# Patient Record
Sex: Female | Born: 2005 | Race: Black or African American | Hispanic: No | Marital: Single | Smoking: Former smoker
Health system: Southern US, Community
[De-identification: ages and names within clinical notes are randomized; demographics above are authoritative.]

## PROBLEM LIST (undated history)

## (undated) DIAGNOSIS — L309 Dermatitis, unspecified: Secondary | ICD-10-CM

## (undated) HISTORY — PX: WISDOM TOOTH EXTRACTION: SHX21

## (undated) HISTORY — DX: Dermatitis, unspecified: L30.9

---
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
## 2023-01-07 DIAGNOSIS — H5213 Myopia, bilateral: Secondary | ICD-10-CM | POA: Diagnosis not present

## 2023-02-06 ENCOUNTER — Encounter: Payer: Self-pay | Admitting: Advanced Practice Midwife

## 2023-03-25 DIAGNOSIS — Z0000 Encounter for general adult medical examination without abnormal findings: Secondary | ICD-10-CM | POA: Diagnosis not present

## 2023-03-25 DIAGNOSIS — M545 Low back pain, unspecified: Secondary | ICD-10-CM | POA: Diagnosis not present

## 2023-03-25 DIAGNOSIS — Z7189 Other specified counseling: Secondary | ICD-10-CM | POA: Diagnosis not present

## 2023-03-25 DIAGNOSIS — L209 Atopic dermatitis, unspecified: Secondary | ICD-10-CM | POA: Diagnosis not present

## 2023-03-25 DIAGNOSIS — Z713 Dietary counseling and surveillance: Secondary | ICD-10-CM | POA: Diagnosis not present

## 2023-03-25 DIAGNOSIS — J309 Allergic rhinitis, unspecified: Secondary | ICD-10-CM | POA: Diagnosis not present

## 2023-03-25 DIAGNOSIS — Z1331 Encounter for screening for depression: Secondary | ICD-10-CM | POA: Diagnosis not present

## 2023-04-08 DIAGNOSIS — M545 Low back pain, unspecified: Secondary | ICD-10-CM | POA: Diagnosis not present

## 2023-04-28 DIAGNOSIS — M545 Low back pain, unspecified: Secondary | ICD-10-CM | POA: Diagnosis not present

## 2023-04-28 DIAGNOSIS — M546 Pain in thoracic spine: Secondary | ICD-10-CM | POA: Diagnosis not present

## 2023-05-03 DIAGNOSIS — L209 Atopic dermatitis, unspecified: Secondary | ICD-10-CM | POA: Diagnosis not present

## 2023-05-03 DIAGNOSIS — J309 Allergic rhinitis, unspecified: Secondary | ICD-10-CM | POA: Diagnosis not present

## 2023-05-03 DIAGNOSIS — Z7189 Other specified counseling: Secondary | ICD-10-CM | POA: Diagnosis not present

## 2023-05-03 DIAGNOSIS — Z0000 Encounter for general adult medical examination without abnormal findings: Secondary | ICD-10-CM | POA: Diagnosis not present

## 2023-05-03 DIAGNOSIS — Z1331 Encounter for screening for depression: Secondary | ICD-10-CM | POA: Diagnosis not present

## 2023-05-03 DIAGNOSIS — M545 Low back pain, unspecified: Secondary | ICD-10-CM | POA: Diagnosis not present

## 2023-05-03 DIAGNOSIS — Z713 Dietary counseling and surveillance: Secondary | ICD-10-CM | POA: Diagnosis not present

## 2023-05-12 ENCOUNTER — Encounter: Payer: Self-pay | Admitting: Advanced Practice Midwife

## 2023-05-15 ENCOUNTER — Encounter: Payer: Self-pay | Admitting: Advanced Practice Midwife

## 2023-05-15 ENCOUNTER — Other Ambulatory Visit (HOSPITAL_COMMUNITY)
Admission: RE | Admit: 2023-05-15 | Discharge: 2023-05-15 | Disposition: A | Discharge status: Home / Self Care | Payer: Medicaid Other | Source: Ambulatory Visit | Attending: Advanced Practice Midwife | Admitting: Advanced Practice Midwife

## 2023-05-15 ENCOUNTER — Ambulatory Visit (INDEPENDENT_AMBULATORY_CARE_PROVIDER_SITE_OTHER): Payer: Medicaid Other | Admitting: Advanced Practice Midwife

## 2023-05-15 VITALS — BP 101/61 | HR 80 | Ht 62.0 in | Wt 91.0 lb

## 2023-05-15 DIAGNOSIS — N898 Other specified noninflammatory disorders of vagina: Secondary | ICD-10-CM

## 2023-05-15 DIAGNOSIS — L989 Disorder of the skin and subcutaneous tissue, unspecified: Secondary | ICD-10-CM

## 2023-05-15 DIAGNOSIS — L732 Hidradenitis suppurativa: Secondary | ICD-10-CM

## 2023-05-15 DIAGNOSIS — Z01419 Encounter for gynecological examination (general) (routine) without abnormal findings: Secondary | ICD-10-CM

## 2023-05-15 DIAGNOSIS — Z0000 Encounter for general adult medical examination without abnormal findings: Secondary | ICD-10-CM

## 2023-05-15 DIAGNOSIS — Z3045 Encounter for surveillance of transdermal patch hormonal contraceptive device: Secondary | ICD-10-CM

## 2023-05-15 MED ORDER — DOXYCYCLINE HYCLATE 100 MG PO CAPS: 100.0000 mg | ORAL_CAPSULE | Freq: Two times a day (BID) | ORAL | 1 refills | Status: AC

## 2023-05-15 MED ORDER — NORELGESTROMIN-ETH ESTRADIOL 150-35 MCG/24HR TD PTWK: 1.0000 | MEDICATED_PATCH | TRANSDERMAL | 12 refills | Status: DC

## 2023-05-15 NOTE — Progress Notes (Signed)
Gynecology Annual Exam  PCP: Hermenia Fiscal, MD  Chief Complaint:  Chief Complaint  Patient presents with   Annual Exam   Contraception    Refill on patch    History of Present Illness: Patient is a 17 y.o. G0P0000 presents for annual exam. The patient has complaint today of ongoing painful bumps in pubic area. The bumps occur approximately every other month around the time of her period. They are painful and drain pus. They are itchy after they drain. Today she points out a painful swollen area in the upper thigh/groin area on the right. She also mentions vaginal odor. Will collect vaginitis aptima.   LMP: Patient's last menstrual period was 05/11/2023.  Average Interval: regular, 28 days Duration of flow:  5-7  days Heavy Menses: no Clots: no Intermenstrual Bleeding: no Postcoital Bleeding: no Dysmenorrhea: no  The patient is sexually active. She currently uses  Xulane patch  for contraception. She denies dyspareunia.  The patient does perform self breast exams.  There is no notable family history of breast or ovarian cancer in her family.  The patient wears seatbelts: yes.  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 She doesn't have much of an appetite and what she eats is healthy. She admits adequate hydration and sleep.    The patient denies current symptoms of depression.    Review of Systems: Review of Systems  Constitutional:  Negative for chills and fever.  HENT:  Positive for congestion. Negative for ear discharge, ear pain, hearing loss, sinus pain and sore throat.   Eyes:  Negative for blurred vision and double vision.  Respiratory:  Negative for cough, shortness of breath and wheezing.   Cardiovascular:  Negative for chest pain, palpitations and leg swelling.  Gastrointestinal:  Negative for abdominal pain, blood in stool, constipation, diarrhea, heartburn, melena, nausea and vomiting.  Genitourinary:  Negative for dysuria, flank  pain, frequency, hematuria and urgency.  Musculoskeletal:  Negative for back pain, joint pain and myalgias.  Skin:  Negative for itching and rash.       Positive for bump in pubic area  Neurological:  Negative for dizziness, tingling, tremors, sensory change, speech change, focal weakness, seizures, loss of consciousness, weakness and headaches.  Endo/Heme/Allergies:  Positive for environmental allergies. Does not bruise/bleed easily.  Psychiatric/Behavioral:  Negative for depression, hallucinations, memory loss, substance abuse and suicidal ideas. The patient is not nervous/anxious and does not have insomnia.     Past Medical History:  There are no problems to display for this patient.   Past Surgical History:  History reviewed. No pertinent surgical history.  Gynecologic History:  Patient's last menstrual period was 05/11/2023. Contraception:  Xulane patch   Obstetric History: G0P0000  Family History:  Family History  Problem Relation Age of Onset   Breast cancer Maternal Grandmother 82    Social History:  Social History   Socioeconomic History   Marital status: Single    Spouse name: Not on file   Number of children: Not on file   Years of education: Not on file   Highest education level: Not on file  Occupational History   Not on file  Tobacco Use   Smoking status: Former    Types: E-cigarettes    Start date: 09/17/2019    Quit date: 04/16/2022    Years since quitting: 1.0   Smokeless tobacco: Never  Vaping Use   Vaping status: Former   Start date: 09/17/2019  Quit date: 04/16/2022  Substance and Sexual Activity   Alcohol use: Yes   Drug use: Not Currently   Sexual activity: Yes    Birth control/protection: Patch  Other Topics Concern   Not on file  Social History Narrative   ** Merged History Encounter **       Social Determinants of Health   Financial Resource Strain: Not on file  Food Insecurity: Not on file  Transportation Needs: Not on file   Physical Activity: Not on file  Stress: Not on file  Social Connections: Not on file  Intimate Partner Violence: Not on file    Allergies:  No Known Allergies  Medications: Prior to Admission medications   Medication Sig Start Date End Date Taking? Authorizing Provider  cetirizine (ZYRTEC) 10 MG tablet Take 10 mg by mouth at bedtime as needed. 03/26/23  Yes [provider]  doxycycline (VIBRAMYCIN) 100 MG capsule Take 1 capsule (100 mg total) by mouth 2 (two) times daily for 14 days. 05/15/23 05/29/23 Yes Tresea Mall, CNM  methocarbamol (ROBAXIN) 500 MG tablet Take 1 tablet every 8 hours by oral route as needed. 04/08/23  Yes [provider]  triamcinolone cream (KENALOG) 0.1 % Apply 1 application topically 2 (two) times daily as needed. Avoid face, groin, underarms 01/03/21  Yes Moye, [place], MD  norelgestromin-ethinyl estradiol Burr Medico) 150-35 MCG/24HR transdermal patch Place 1 patch onto the skin once a week. 05/15/23   Tresea Mall, CNM    Physical Exam Vitals: Blood pressure 101/61, pulse 80, height 5\' 2"  (1.575 m), weight 91 lb (41.3 kg), last menstrual period 05/11/2023.  General: NAD HEENT: normocephalic, anicteric Thyroid: no enlargement, no palpable nodules Pulmonary: No increased work of breathing, CTAB Cardiovascular: RRR, distal pulses 2+ Breast: Breast symmetrical, no tenderness, no palpable nodules or masses, no skin or nipple retraction present, no nipple discharge.  No axillary or supraclavicular lymphadenopathy. Abdomen: NABS, soft, non-tender, non-distended.  Umbilicus without lesions.  No hepatomegaly, splenomegaly or masses palpable. No evidence of hernia  Genitourinary:  External: Lesion on right mons- open/drained, red and hard rope like bump in right upper thigh/groin, tender to palpation. Evidence of past lesions on mons appearing as scar tissue with some small fistulas  Extremities: no edema, erythema, or tenderness Neurologic: Grossly  intact Psychiatric: mood appropriate, affect full   Assessment: 17 y.o. G0P0000 routine annual exam  Plan: Problem List Items Addressed This Visit   None Visit Diagnoses     Well woman exam without gynecological exam    -  Primary   Relevant Medications   norelgestromin-ethinyl estradiol Burr Medico) 150-35 MCG/24HR transdermal patch   Encounter for surveillance of transdermal patch hormonal contraceptive device       Relevant Medications   norelgestromin-ethinyl estradiol Burr Medico) 150-35 MCG/24HR transdermal patch   Vaginal odor       Relevant Orders   Cervicovaginal ancillary only   Hidradenitis suppurativa       Relevant Medications   doxycycline (VIBRAMYCIN) 100 MG capsule   Skin lesion       Relevant Orders   Herpes simplex virus culture       1) 4) Gardasil Series discussed and if applicable offered to patient - Patient has not previously completed 3 shot series   2) STI screening  was offered and declined  3)  ASCCP guidelines and rationale discussed.  Patient opts for  starting at age 41  screening interval  4) Contraception - the patient is currently using   Financial risk analyst .  She is happy with her current form of contraception and plans to continue We discussed safe sex practices to reduce her furture risk of STI's.    5) Skin lesions on Mons Pubis likely hidradenitis suppurativa. HSV culture collected on open lesion to rule out. Will try a course of Doxycycline.  6) Return in about 1 year (around 05/14/2024) for annual established gyn.   Tresea Mall, CNM [facility] 05/15/2023 3:54 PM

## 2023-05-15 NOTE — Patient Instructions (Signed)
Hidradenitis Suppurativa Hidradenitis suppurativa is a long-term (chronic) skin disease. It is similar to a severe form of acne, but it affects areas of the body where acne would be unusual, especially areas of the body where skin rubs against skin and becomes moist. These include: Underarms. Groin. Genital area. Buttocks. Upper thighs. Breasts. Hidradenitis suppurativa may start out as small lumps or pimples caused by blocked skin pores, sweat glands, or hair follicles. Pimples may develop into deep sores that break open (rupture) and drain pus. Over time, affected areas of skin may thicken and become scarred. This condition is rare and does not spread from person to person (non-contagious). What are the causes? The exact cause of this condition is not known. It may be related to: Female and female hormones. An overactive disease-fighting system (immune system). The immune system may over-react to blocked hair follicles or sweat glands and cause swelling and pus-filled sores. What increases the risk? You are more likely to develop this condition if you: Are female. Are 11-55 years old. Have a family history of hidradenitis suppurativa. Have a personal history of acne. Are overweight. Smoke. Take the medicine lithium. What are the signs or symptoms? The first symptoms are usually painful bumps in the skin, similar to pimples. The condition may get worse over time (progress), or it may only cause mild symptoms. If the disease progresses, symptoms may include: Skin bumps getting bigger and growing deeper into the skin. Bumps rupturing and draining pus. Itchy, infected skin. Skin getting thicker and scarred. Tunnels under the skin (fistulas) where pus drains from a bump. Pain during daily activities, such as pain during walking if your groin area is affected. Emotional problems, such as stress or depression. This condition may affect your appearance and your ability or willingness to wear  certain clothes or do certain activities. How is this diagnosed? This condition is diagnosed by a health care provider who specializes in skin conditions (dermatologist). You may be diagnosed based on: Your symptoms and medical history. A physical exam. Testing a pus sample for infection. Blood tests. How is this treated? Your treatment will depend on how severe your symptoms are. The same treatment will not work for everybody with this condition. You may need to try several treatments to find what works best for you. Treatment may include: Cleaning and bandaging (dressing) your wounds as needed. Lifestyle changes, such as new skin care routines. Taking medicines, such as: Antibiotics. Acne medicines. Medicines to reduce the activity of the immune system. A diabetes medicine (metformin). Birth control pills, for women. Steroids to reduce swelling and pain. Working with a mental health care provider, if you experience emotional distress due to this condition. If you have severe symptoms that do not get better with medicine, you may need surgery. Surgery may involve: Using a laser to clear the skin and remove hair follicles. Opening and draining deep sores. Removing the areas of skin that are diseased and scarred. Follow these instructions at home: Medicines  Take over-the-counter and prescription medicines only as told by your health care provider. If you were prescribed antibiotics, take them as told by your health care provider. Do not stop using the antibiotic even if your condition improves. Skin care If you have open wounds, cover them with a clean dressing as told by your health care provider. Keep wounds clean by washing them gently with soap and water when you bathe. Do not shave the areas where you get hidradenitis suppurativa. Wear loose-fitting clothes. Try to avoid   getting overheated or sweaty. If you get sweaty or wet, change into clean, dry clothes as soon as you can. To  help relieve pain and itchiness, cover sore areas with a warm, clean washcloth (warm compress) for 5-10 minutes as often as needed. Your healthcare provider may recommend an antiperspirant deodorant that may be gentle on your skin. A daily antiseptic wash to cleanse affected areas may be suggested by your healthcare provider. General instructions Learn as much as you can about your disease so that you have an active role in your treatment. Work closely with your health care provider to find treatments that work for you. If you are overweight, work with your health care provider to lose weight as recommended. Do not use any products that contain nicotine or tobacco. These products include cigarettes, chewing tobacco, and vaping devices, such as e-cigarettes. If you need help quitting, ask your health care provider. If you struggle with living with this condition, talk with your health care provider or work with a mental health care provider as recommended. Keep all follow-up visits. Where to find more information Hidradenitis Suppurativa Foundation, Inc.: www.hs-foundation.org American Academy of Dermatology: www.aad.org Contact a health care provider if: You have a flare-up of hidradenitis suppurativa. You have a fever or chills. You have trouble controlling your symptoms at home. You have trouble doing your daily activities because of your symptoms. You have trouble dealing with emotional problems related to your condition. Summary Hidradenitis suppurativa is a long-term (chronic) skin disease. It is similar to a severe form of acne, but it affects areas of the body where acne would be unusual. The first symptoms are usually painful bumps in the skin, similar to pimples. The condition may only cause mild symptoms, or it may get worse over time (progress). If you have open wounds, cover them with a clean dressing as told by your health care provider. Keep wounds clean by washing them gently with  soap and water when you bathe. Besides skin care, treatment may include medicines, laser treatment, and surgery. This information is not intended to replace advice given to you by your health care provider. Make sure you discuss any questions you have with your health care provider. Document Revised: 10/24/2021 Document Reviewed: 10/24/2021 Elsevier Patient Education  2024 Elsevier Inc.  

## 2023-05-16 ENCOUNTER — Other Ambulatory Visit: Payer: Self-pay

## 2023-05-16 DIAGNOSIS — Z3045 Encounter for surveillance of transdermal patch hormonal contraceptive device: Secondary | ICD-10-CM

## 2023-05-16 DIAGNOSIS — Z0000 Encounter for general adult medical examination without abnormal findings: Secondary | ICD-10-CM

## 2023-05-16 MED ORDER — NORELGESTROMIN-ETH ESTRADIOL 150-35 MCG/24HR TD PTWK: 1.0000 | MEDICATED_PATCH | TRANSDERMAL | 12 refills | Status: DC

## 2023-05-16 NOTE — Telephone Encounter (Signed)
Patient contacted office stating that she went to pharmacy to pick up her birth control but was told they did not have a prescription. On 05/15/23 Xulane patch was sent to [facility] pharmacy on [address] and pharmacy receipt of confirmation was received at 2:01PM. When I called and spoke to pharmacist they stated they did not have prescription and I was advised to resend. KW

## 2023-05-18 LAB — HERPES SIMPLEX VIRUS CULTURE

## 2023-05-22 LAB — CERVICOVAGINAL ANCILLARY ONLY
Bacterial Vaginitis (gardnerella): POSITIVE — AB
Candida Glabrata: NEGATIVE
Candida Vaginitis: POSITIVE — AB
Comment: NEGATIVE
Comment: NEGATIVE
Comment: NEGATIVE

## 2023-06-03 ENCOUNTER — Other Ambulatory Visit: Payer: Self-pay | Admitting: Advanced Practice Midwife

## 2023-06-03 DIAGNOSIS — B3731 Acute candidiasis of vulva and vagina: Secondary | ICD-10-CM

## 2023-06-03 DIAGNOSIS — B9689 Other specified bacterial agents as the cause of diseases classified elsewhere: Secondary | ICD-10-CM

## 2023-06-03 MED ORDER — METRONIDAZOLE 0.75 % VA GEL
1.0000 | Freq: Every day | VAGINAL | 1 refills | Status: AC
Start: 2023-06-03 — End: 2023-06-08

## 2023-06-03 MED ORDER — FLUCONAZOLE 150 MG PO TABS
150.0000 mg | ORAL_TABLET | Freq: Once | ORAL | 1 refills | Status: AC
Start: 2023-06-03 — End: 2023-06-03

## 2023-06-03 NOTE — Progress Notes (Signed)
Metrogel and diflucan Rx's sent. Message sent to patient.

## 2023-06-26 ENCOUNTER — Encounter: Payer: Self-pay | Admitting: Advanced Practice Midwife

## 2023-07-01 ENCOUNTER — Other Ambulatory Visit: Payer: Self-pay

## 2023-07-01 MED ORDER — FLUCONAZOLE 150 MG PO TABS
150.0000 mg | ORAL_TABLET | ORAL | 2 refills | Status: DC
Start: 2023-07-01 — End: 2023-08-18

## 2023-08-10 ENCOUNTER — Encounter: Payer: Self-pay | Admitting: Advanced Practice Midwife

## 2023-08-18 ENCOUNTER — Ambulatory Visit (INDEPENDENT_AMBULATORY_CARE_PROVIDER_SITE_OTHER): Payer: BC Managed Care – PPO | Admitting: Advanced Practice Midwife

## 2023-08-18 ENCOUNTER — Encounter: Payer: Self-pay | Admitting: Advanced Practice Midwife

## 2023-08-18 ENCOUNTER — Other Ambulatory Visit (HOSPITAL_COMMUNITY)
Admission: RE | Admit: 2023-08-18 | Discharge: 2023-08-18 | Disposition: A | Discharge status: Home / Self Care | Payer: BC Managed Care – PPO | Source: Ambulatory Visit | Attending: Advanced Practice Midwife | Admitting: Advanced Practice Midwife

## 2023-08-18 VITALS — BP 103/63 | HR 67 | Ht 60.0 in | Wt 100.5 lb

## 2023-08-18 DIAGNOSIS — B3731 Acute candidiasis of vulva and vagina: Secondary | ICD-10-CM

## 2023-08-18 DIAGNOSIS — N898 Other specified noninflammatory disorders of vagina: Secondary | ICD-10-CM

## 2023-08-18 DIAGNOSIS — N760 Acute vaginitis: Secondary | ICD-10-CM | POA: Diagnosis not present

## 2023-08-18 DIAGNOSIS — L732 Hidradenitis suppurativa: Secondary | ICD-10-CM | POA: Diagnosis not present

## 2023-08-18 MED ORDER — FLUCONAZOLE 150 MG PO TABS
150.0000 mg | ORAL_TABLET | Freq: Once | ORAL | 1 refills | Status: AC
Start: 2023-08-18 — End: 2023-08-18

## 2023-08-18 MED ORDER — DOXYCYCLINE HYCLATE 100 MG PO CAPS
100.0000 mg | ORAL_CAPSULE | Freq: Two times a day (BID) | ORAL | 1 refills | Status: AC
Start: 2023-08-18 — End: 2023-09-01

## 2023-08-18 NOTE — Telephone Encounter (Signed)
Scheduled 12/2 with JEG.

## 2023-08-19 ENCOUNTER — Encounter: Payer: Self-pay | Admitting: Advanced Practice Midwife

## 2023-08-19 MED ORDER — METRONIDAZOLE 500 MG PO TABS
500.0000 mg | ORAL_TABLET | Freq: Two times a day (BID) | ORAL | 0 refills | Status: AC
Start: 2023-08-19 — End: 2023-08-26

## 2023-08-19 NOTE — Progress Notes (Signed)
Patient ID: Leslie Barry, female   DOB: 09/30/2005, 17 y.o.   MRN: [id number]  Reason for Consult: Follow-up (Medication not helping)   Subjective:  Date of Service: 08/18/2023  HPI:  Leslie Barry is a 17 y.o. female being seen for returning symptoms of BV. She was seen at the end of August and treated for BV and yeast at that time. We also empirically treated for Hidradenitis with Doxycycline. She now has vaginal odor and irritation that started about a week ago when she had her period. She denies vaginal discharge or itching at this time. She doesn't think the Doxycycline helped her pubic area bumps. She also reports some breast tenderness a few weeks ago- none currently.  We discussed possible recurring BV. She would like to take pills this time if she does have BV. It appears that the bumps on Mons Pubis have improved on exam. I suggested she could try another course of Doxy to see if it improves more. Also reviewed comfort and preventive measures for vaginitis.   No past medical history on file. Family History  Problem Relation Age of Onset   Breast cancer Maternal Grandmother 60   No past surgical history on file.  Short Social History:  Social History   Tobacco Use   Smoking status: Former    Types: E-cigarettes    Start date: 09/17/2019    Quit date: 04/16/2022    Years since quitting: 1.3   Smokeless tobacco: Never  Substance Use Topics   Alcohol use: Yes    No Known Allergies  Current Outpatient Medications  Medication Sig Dispense Refill   cetirizine (ZYRTEC) 10 MG tablet Take 10 mg by mouth at bedtime as needed.     doxycycline (VIBRAMYCIN) 100 MG capsule Take 1 capsule (100 mg total) by mouth 2 (two) times daily for 14 days. 28 capsule 1   norelgestromin-ethinyl estradiol (XULANE) 150-35 MCG/24HR transdermal patch Place 1 patch onto the skin once a week. 3 patch 12   methocarbamol (ROBAXIN) 500 MG tablet Take 1 tablet every 8 hours by oral route as needed.  (Patient not taking: Reported on 08/18/2023)     triamcinolone cream (KENALOG) 0.1 % Apply 1 application topically 2 (two) times daily as needed. Avoid face, groin, underarms (Patient not taking: Reported on 08/18/2023) 80 g 2   No current facility-administered medications for this visit.    Review of Systems  Constitutional:  Negative for chills and fever.  HENT:  Negative for congestion, ear discharge, ear pain, hearing loss, sinus pain and sore throat.   Eyes:  Negative for blurred vision and double vision.  Respiratory:  Negative for cough, shortness of breath and wheezing.   Cardiovascular:  Negative for chest pain, palpitations and leg swelling.  Gastrointestinal:  Negative for abdominal pain, blood in stool, constipation, diarrhea, heartburn, melena, nausea and vomiting.  Genitourinary:  Negative for dysuria, flank pain, frequency, hematuria and urgency.       Positive for vaginal odor and irritation  Musculoskeletal:  Negative for back pain, joint pain and myalgias.  Skin:  Negative for itching and rash.  Neurological:  Negative for dizziness, tingling, tremors, sensory change, speech change, focal weakness, seizures, loss of consciousness, weakness and headaches.  Endo/Heme/Allergies:  Negative for environmental allergies. Does not bruise/bleed easily.  Psychiatric/Behavioral:  Negative for depression, hallucinations, memory loss, substance abuse and suicidal ideas. The patient is not nervous/anxious and does not have insomnia.   Breast: positive for tenderness      Objective:  Objective   Vitals:   08/18/23 1408  BP: 103/63  Pulse: 67  Weight: 100 lb 8 oz (45.6 kg)  Height: 5' (1.524 m)   Body mass index is 19.63 kg/m. Constitutional: Well nourished, well developed female in no acute distress.  HEENT: normal Skin: Warm and dry.  Extremity:  no edema   Respiratory:  Normal respiratory effort Psych: Alert and Oriented x3. No memory deficits. Normal mood and affect.     Pelvic exam:  is not limited by body habitus EGBUS: within normal limits, small intact bump on mons Vagina: within normal limits and with normal mucosa, scant discharge   Data: Results of aptima swab pending Assessment/Plan:     17 y.o. G0 P0 female with possible BV  Aptima vaginitis Reorder Doxycycline for additional 14 days Diflucan to take of yeast develops Rx Metronidazole- take if positive for BV Follow up as needed   Tresea Mall, CNM [facility] 08/19/2023 5:49 PM

## 2023-08-20 LAB — CERVICOVAGINAL ANCILLARY ONLY
Bacterial Vaginitis (gardnerella): POSITIVE — AB
Candida Glabrata: NEGATIVE
Candida Vaginitis: NEGATIVE
Comment: NEGATIVE
Comment: NEGATIVE
Comment: NEGATIVE

## 2023-09-25 ENCOUNTER — Other Ambulatory Visit: Payer: Self-pay

## 2023-09-25 ENCOUNTER — Ambulatory Visit
Admission: RE | Admit: 2023-09-25 | Discharge: 2023-09-25 | Disposition: A | Discharge status: Home / Self Care | Payer: Medicaid Other | Source: Ambulatory Visit | Attending: Emergency Medicine | Admitting: Emergency Medicine

## 2023-09-25 ENCOUNTER — Ambulatory Visit (INDEPENDENT_AMBULATORY_CARE_PROVIDER_SITE_OTHER): Payer: Medicaid Other

## 2023-09-25 ENCOUNTER — Telehealth: Payer: Self-pay | Admitting: Emergency Medicine

## 2023-09-25 ENCOUNTER — Other Ambulatory Visit: Payer: Medicaid Other

## 2023-09-25 VITALS — BP 121/81 | HR 78 | Temp 98.9°F | Resp 16

## 2023-09-25 DIAGNOSIS — R0981 Nasal congestion: Secondary | ICD-10-CM

## 2023-09-25 DIAGNOSIS — R0989 Other specified symptoms and signs involving the circulatory and respiratory systems: Secondary | ICD-10-CM

## 2023-09-25 DIAGNOSIS — R0789 Other chest pain: Secondary | ICD-10-CM

## 2023-09-25 MED ORDER — IPRATROPIUM BROMIDE 0.03 % NA SOLN: 2.0000 | Freq: Two times a day (BID) | NASAL | 2 refills | Status: DC

## 2023-09-25 MED ORDER — DEXAMETHASONE SODIUM PHOSPHATE 10 MG/ML IJ SOLN
10.0000 mg | Freq: Once | INTRAMUSCULAR | Status: AC
  Administered 2023-09-25: 10 mg via INTRAMUSCULAR

## 2023-09-25 MED ORDER — CYCLOBENZAPRINE HCL 10 MG PO TABS: 10.0000 mg | ORAL_TABLET | Freq: Every day | ORAL | 0 refills | Status: DC

## 2023-09-25 MED ORDER — PREDNISONE 10 MG (21) PO TBPK: ORAL_TABLET | Freq: Every day | ORAL | 0 refills | Status: DC

## 2023-09-25 MED ORDER — LORATADINE-D 12HR 5-120 MG PO TB12: 1.0000 | ORAL_TABLET | Freq: Two times a day (BID) | ORAL | 1 refills | Status: DC

## 2023-09-25 NOTE — ED Triage Notes (Signed)
 Pt states she is having chest congestion with clear and yellow mucus,  nasal drainage that goes down her throat x 9 months. Now having weakness in left shoulder that is painful to move and pain.

## 2023-09-25 NOTE — Discharge Instructions (Addendum)
 Chest x-ray is negative   For your congestion  -Stop use of Zyrtec  and begin Loratadine -D which is an allergy medicine plus a decongestant, take twice daily -Use nasal spray every morning and every evening to further help clear out congestion -Burning to the nose this is typically due to dryness, coat the nose with petroleum jelly once daily -Avoid dry stuffy areas as this will worsen your symptoms  For your chest pain -EKG shows that heart is beating in a regular pace and rhythm - Blood pressure heart rate and oxygen level are all within normal ranges -Normal heart sounds and lung sounds are heard when you are listen to -On exam I am able to press your chest wall and cause your pain which is a sign of muscular irritation -You have been given an injection of Decadron  which is a steroid that reduces internal inflammation and helps with pain and ideally will start to see improvement within the hour -Starting tomorrow take prednisone  every morning with food for 6 days to continue the process above -You may use muscle relaxant at bedtime to get comfortable for rest -May use heat over the affected area 10 to 15-minute intervals -May massage and stretch area as tolerated  If you continue to have symptoms you will need to find a primary doctor for further evaluation and management

## 2023-09-25 NOTE — ED Provider Notes (Signed)
 Leslie Barry    CSN: [id number] Arrival date & time: 09/25/23  1426      History   Chief Complaint Chief Complaint  Patient presents with   Chest Injury    chest hurts for months, sinuses,  i've been spitting since april - Entered by patient   Nasal Congestion    HPI Leslie Barry is a 18 y.o. female.   Patient presents for evaluation of persisting nasal congestion, postnasal drip and chest congestion present for 9 months.  Endorses excess spitting and expelling mucus, at least 20 times per day.  Endorses she has been evaluated by her PCP who recommended allergy medicine, recently discontinued at the start of the month as she is seeing minimal improvement as spitting decreased from 20 times to 12.  Over the past 3 months has had constant left-sided chest pain exacerbated by movement and touch.  Has made it painful to take deep breaths but denies shortness of breath or wheezing.  Pain exacerbated when left arm is lifted above head.  Has not attempted treatment.  Denies personal cardiac history.  Former smoker, quit in August 2023.  Denies injury or trauma to the chest wall.  Denies dizziness, lightheadedness, weakness, vision changes.  History reviewed. No pertinent past medical history.  There are no active problems to display for this patient.   History reviewed. No pertinent surgical history.  OB History     Gravida  0   Para  0   Term  0   Preterm  0   AB  0   Living  0      SAB  0   IAB  0   Ectopic  0   Multiple  0   Live Births  0            Home Medications    Prior to Admission medications   Medication Sig Start Date End Date Taking? Authorizing Provider  cyclobenzaprine  (FLEXERIL ) 10 MG tablet Take 1 tablet (10 mg total) by mouth at bedtime. 09/25/23  Yes Greydis Stlouis R, NP  ipratropium (ATROVENT ) 0.03 % nasal spray Place 2 sprays into both nostrils every 12 (twelve) hours. 09/25/23  Yes Izel Eisenhardt, Shelba SAUNDERS, NP   loratadine -pseudoephedrine (LORATADINE -D 12HR) 5-120 MG tablet Take 1 tablet by mouth 2 (two) times daily. 09/25/23  Yes Neysha Criado, Shelba SAUNDERS, NP  norelgestromin -ethinyl estradiol  (XULANE) 150-35 MCG/24HR transdermal patch Place 1 patch onto the skin once a week. 05/16/23  Yes Lynda Bradley, CNM  predniSONE  (STERAPRED UNI-PAK 21 TAB) 10 MG (21) TBPK tablet Take by mouth daily. Take 6 tabs by mouth daily  for 1 days, then 5 tabs for 1 days, then 4 tabs for 1 days, then 3 tabs for 1 days, 2 tabs for 1 days, then 1 tab by mouth daily for 1 days 09/25/23  Yes Teresa Shelba R, NP  fluconazole  (DIFLUCAN ) 150 MG tablet SMARTSIG:1.0 Tablet(s) By Mouth Once 08/18/23   [provider]  methocarbamol (ROBAXIN) 500 MG tablet Take 1 tablet every 8 hours by oral route as needed. Patient not taking: Reported on 08/18/2023 04/08/23   [provider]  triamcinolone  cream (KENALOG ) 0.1 % Apply 1 application topically 2 (two) times daily as needed. Avoid face, groin, underarms Patient not taking: Reported on 08/18/2023 01/03/21   Moye, [place] , MD    Family History Family History  Problem Relation Age of Onset   Breast cancer Maternal Grandmother 9    Social History Social History   Tobacco Use  Smoking status: Former    Types: E-cigarettes    Start date: 09/17/2019    Quit date: 04/16/2022    Years since quitting: 1.4   Smokeless tobacco: Never  Vaping Use   Vaping status: Former   Start date: 09/17/2019   Quit date: 04/16/2022  Substance Use Topics   Alcohol  use: Yes   Drug use: Not Currently     Allergies   Patient has no known allergies.   Review of Systems Review of Systems   Physical Exam Triage Vital Signs ED Triage Vitals  Encounter Vitals Group     BP 09/25/23 1437 121/81     Systolic BP Percentile --      Diastolic BP Percentile --      Pulse Rate 09/25/23 1437 78     Resp 09/25/23 1437 16     Temp 09/25/23 1437 98.9 F (37.2 C)     Temp Source 09/25/23 1437 Oral      SpO2 09/25/23 1437 100 %     Weight --      Height --      Head Circumference --      Peak Flow --      Pain Score 09/25/23 1444 7     Pain Loc --      Pain Education --      Exclude from Growth Chart --    No data found.  Updated Vital Signs BP 121/81 (BP Location: Left Arm)   Pulse 78   Temp 98.9 F (37.2 C) (Oral)   Resp 16   LMP 09/05/2023 (Exact Date) Comment: pt on birth control patch  SpO2 100%   Visual Acuity Right Eye Distance:   Left Eye Distance:   Bilateral Distance:    Right Eye Near:   Left Eye Near:    Bilateral Near:     Physical Exam Constitutional:      Appearance: Normal appearance.  HENT:     Nose: Congestion present. No rhinorrhea.  Eyes:     Extraocular Movements: Extraocular movements intact.  Cardiovascular:     Rate and Rhythm: Normal rate and regular rhythm.     Pulses: Normal pulses.     Heart sounds: Normal heart sounds.  Pulmonary:     Effort: Pulmonary effort is normal.     Breath sounds: Normal breath sounds.  Chest:       Comments: Tenderness present to the lateral aspect of the left chest wall, no ecchymosis swelling or deformity, chest wall is symmetrical,  No abnormality to the left breast or nipple Musculoskeletal:     Comments: No tenderness to the left shoulder, able to complete range of motion pain elicited with abduction, negative Hawking sign  Neurological:     Mental Status: She is alert and oriented to person, place, and time. Mental status is at baseline.      UC Treatments / Results  Labs (all labs ordered are listed, but only abnormal results are displayed) Labs Reviewed - No data to display  EKG   Radiology DG Chest 2 View Result Date: 09/25/2023 CLINICAL DATA:  Chest congestion EXAM: CHEST - 2 VIEW COMPARISON:  None Available. FINDINGS: The heart size and mediastinal contours are within normal limits. Both lungs are clear. The visualized skeletal structures are unremarkable. IMPRESSION: No active  cardiopulmonary disease. Electronically Signed   By: Bard Moats M.D.   On: 09/25/2023 15:02    Procedures Procedures (including critical care time)  Medications Ordered in UC Medications  dexamethasone  (DECADRON ) injection 10 mg (10 mg Intramuscular Given 09/25/23 1534)    Initial Impression / Assessment and Plan / UC Course  I have reviewed the triage vital signs and the nursing notes.  Pertinent labs & imaging results that were available during my care of the patient were reviewed by me and considered in my medical decision making (see chart for details).  Chronic nasal congestion, chest congestion, left-sided chest wall pain  Vitals are stable, patient is in no signs of distress nontoxic-appearing, congestion to the nasal turbinates noted on exam, lung and heart sounds are within normal limit, stable for outpatient management, chest x-ray is negative, EKG shows normal sinus rhythm as chest wall pain is reproducible most likely muscular in etiology, discussed this with patient, Decadron  IM given and prescribed prednisone  and Flexeril  for home usage, recommended RICE, heat massage and stretching for additional support, discontinue Zyrtec  and prescribed Claritin -D and ipratropium nasal spray for management, currently does not have PCP, instructed on how to set up follow-up appointment PCP referral placed Final Clinical Impressions(s) / UC Diagnoses   Final diagnoses:  Chest congestion  Left-sided chest wall pain  Chronic nasal congestion     Discharge Instructions      Chest x-ray is negative   For your congestion  -Stop use of Zyrtec  and begin Loratadine -D which is an allergy medicine plus a decongestant, take twice daily -Use nasal spray every morning and every evening to further help clear out congestion -Burning to the nose this is typically due to dryness, coat the nose with petroleum jelly once daily -Avoid dry stuffy areas as this will worsen your symptoms  For your chest  pain -EKG shows that heart is beating in a regular pace and rhythm - Blood pressure heart rate and oxygen level are all within normal ranges -Normal heart sounds and lung sounds are heard when you are listen to -On exam I am able to press your chest wall and cause your pain which is a sign of muscular irritation -You have been given an injection of Decadron  which is a steroid that reduces internal inflammation and helps with pain and ideally will start to see improvement within the hour -Starting tomorrow take prednisone  every morning with food for 6 days to continue the process above -You may use muscle relaxant at bedtime to get comfortable for rest -May use heat over the affected area 10 to 15-minute intervals -May massage and stretch area as tolerated  If you continue to have symptoms you will need to find a primary doctor for further evaluation and management   ED Prescriptions     Medication Sig Dispense Auth. Provider   loratadine -pseudoephedrine (LORATADINE -D 12HR) 5-120 MG tablet Take 1 tablet by mouth 2 (two) times daily. 60 tablet Jerolyn Flenniken R, NP   predniSONE  (STERAPRED UNI-PAK 21 TAB) 10 MG (21) TBPK tablet Take by mouth daily. Take 6 tabs by mouth daily  for 1 days, then 5 tabs for 1 days, then 4 tabs for 1 days, then 3 tabs for 1 days, 2 tabs for 1 days, then 1 tab by mouth daily for 1 days 21 tablet Duey Liller R, NP   cyclobenzaprine  (FLEXERIL ) 10 MG tablet Take 1 tablet (10 mg total) by mouth at bedtime. 10 tablet Talah Cookston R, NP   ipratropium (ATROVENT ) 0.03 % nasal spray Place 2 sprays into both nostrils every 12 (twelve) hours. 30 mL Teresa Shelba SAUNDERS, NP      PDMP not reviewed this encounter.  Teresa Shelba SAUNDERS, NP 09/25/23 [phone]

## 2023-09-25 NOTE — Telephone Encounter (Signed)
 error

## 2023-10-14 ENCOUNTER — Encounter: Payer: Self-pay | Admitting: Advanced Practice Midwife

## 2023-11-05 ENCOUNTER — Other Ambulatory Visit (HOSPITAL_COMMUNITY)
Admission: RE | Admit: 2023-11-05 | Discharge: 2023-11-05 | Disposition: A | Discharge status: Home / Self Care | Payer: Medicaid Other | Source: Ambulatory Visit | Attending: Advanced Practice Midwife | Admitting: Advanced Practice Midwife

## 2023-11-05 ENCOUNTER — Ambulatory Visit: Payer: Medicaid Other | Admitting: Advanced Practice Midwife

## 2023-11-05 ENCOUNTER — Encounter: Payer: Self-pay | Admitting: Advanced Practice Midwife

## 2023-11-05 VITALS — BP 91/64 | HR 91 | Ht 60.0 in | Wt 98.0 lb

## 2023-11-05 DIAGNOSIS — N926 Irregular menstruation, unspecified: Secondary | ICD-10-CM

## 2023-11-05 DIAGNOSIS — Z30013 Encounter for initial prescription of injectable contraceptive: Secondary | ICD-10-CM | POA: Diagnosis not present

## 2023-11-05 DIAGNOSIS — N898 Other specified noninflammatory disorders of vagina: Secondary | ICD-10-CM

## 2023-11-05 DIAGNOSIS — N939 Abnormal uterine and vaginal bleeding, unspecified: Secondary | ICD-10-CM

## 2023-11-05 MED ORDER — MEDROXYPROGESTERONE ACETATE 150 MG/ML IM SUSY
150.0000 mg | PREFILLED_SYRINGE | Freq: Once | INTRAMUSCULAR | Status: AC
  Administered 2023-11-05: 150 mg via INTRAMUSCULAR

## 2023-11-05 NOTE — Patient Instructions (Signed)
 Birth Control Shot: What to Expect A birth control shot prevents pregnancy. A birth control shot is put in (injected) into the skin or a muscle. The shot contains the hormone progestin. Hormones are chemicals that affect how the body works. Progestin prevents pregnancy because it: Stops the ovaries from releasing eggs. Makes cervical mucus thicker. This prevents sperm from getting into the cervix. The cervix is the lowest part of the uterus. Thins the lining of the uterus to prevent a fertilized egg from attaching to the uterus. Tell a health care provider about: Any allergies you have. All medicines you take. These include vitamins, herbs, eye drops, and creams. Any bleeding problems you have. Any medical problems you have. Whether you're pregnant or may be pregnant. What are the risks? Your health care provider will talk with you about risks. These may include: Mood changes or depression. Your bones becoming thinner or weaker. This is called loss of bone density. This can happen if you get the shots for a long period of time. This can cause your bones to break. Blood clots. These are rare. A higher risk of an egg being fertilized outside your uterus, called an ectopic pregnancy.This is rare. What happens before? Your provider may do a physical exam. You may have a test to make sure you aren't pregnant. What happens during a birth control shot?  The area where the shot will be given will be cleaned. A needle will be put into a muscle in your upper arm or butt, or into the skin of your thigh or belly. The needle will be put on a syringe with the medicine in it. The medicine will be pushed through the syringe into your body. A small bandage may be put over the place where the shot was given. What happens after? After the shot, it's common to have: Soreness around the place where the shot was given for a couple of days. Spotting or bleeding between periods. Weight gain. Tender  breasts. Headaches. Belly pain. Ask your provider if you need to use an added method of birth control, such as a condom, sponge, or spermicide. If the first shot is given 1-7 days after the start of your last period, you won't need to use an added method of birth control. If the first shot is given at any other time during your menstrual cycle, you'll need to use an added method of birth control for 7 days after you get the shot. Follow these instructions at home: General instructions Take your medicines only as told. Do not rub or massage the place where the shot was given. Track your periods. This will help you know if they become irregular. Always use a condom to protect against sexually transmitted infections (STIs). Make an appointment in time for your next shot and mark it on your calendar. You must get a shot every 3 months (12-13 weeks) to prevent pregnancy. Lifestyle Do not smoke, vape, or use nicotine or tobacco. Eat foods that are high in calcium and vitamin D, such as milk, cheese, and salmon. Calcium helps keep your bones strong and may help with any loss in bone density caused by the birth control shot. Ask your provider if you should take supplements. Contact a health care provider if you: Have discharge or bleeding from your vagina that isn't normal. Miss a period or think you might be pregnant. Have mood changes or depression. Feel dizzy or light-headed. Have leg pain. Get help right away if you: Have chest pain  or cough up blood. Have trouble breathing. Have a really bad headache that doesn't go away. Have numbness in any part of your body. Have really bad pain in your belly. Have slurred speech or vision problems. These symptoms may be an emergency. Call 911 right away. Do not wait to see if the symptoms will go away. Do not drive yourself to the hospital. This information is not intended to replace advice given to you by your health care provider. Make sure you  discuss any questions you have with your health care provider. Document Revised: 05/08/2023 Document Reviewed: 05/08/2023 Elsevier Patient Education  2024 [employer].

## 2023-11-05 NOTE — Progress Notes (Signed)
 Patient ID: Leslie Barry, female   DOB: December 16, 2005, 18 y.o.   MRN: [id number]  Reason for Consult: Vaginal Bleeding (Pt has been bleeding since 10/09/23. No abnormal pain.)   Subjective:  HPI:  Leslie Barry is a 18 y.o. female having daily bleeding since her last period began on 10/09/23. It is light some days and brown some days. She changes her pad once or twice a day. For the prior 2 months she experienced 2 week long periods. She admits that her patch does fall off some weeks and she doesn't always put a new one on. Discussed that the hormones work to control cycle if kept in a steady state and that her periods are irregular due to inconsistencies with the patch. Not currently wearing a patch. She would like to try Depo after reviewing the options. She is not sexually active.   She also reports symptoms of BV today- irritation and odor.   Past Medical History:  Diagnosis Date   Eczema    Family History  Problem Relation Age of Onset   Breast cancer Mother 7   Breast cancer Maternal Grandmother 84   Past Surgical History:  Procedure Laterality Date   WISDOM TOOTH EXTRACTION      Short Social History:  Social History   Tobacco Use   Smoking status: Former    Types: E-cigarettes    Start date: 09/17/2019    Quit date: 04/16/2022    Years since quitting: 1.5   Smokeless tobacco: Never  Substance Use Topics   Alcohol use: Not Currently    No Known Allergies  Current Outpatient Medications  Medication Sig Dispense Refill   ipratropium (ATROVENT) 0.03 % nasal spray Place 2 sprays into both nostrils every 12 (twelve) hours. 30 mL 2   norelgestromin-ethinyl estradiol [employer]) 150-35 MCG/24HR transdermal patch Place 1 patch onto the skin once a week. 3 patch 12   triamcinolone ointment (KENALOG) 0.1 % Apply topically 2 (two) times daily as needed.     No current facility-administered medications for this visit.    Review of Systems  Constitutional:  Negative for chills  and fever.  HENT:  Negative for congestion, ear discharge, ear pain, hearing loss, sinus pain and sore throat.   Eyes:  Negative for blurred vision and double vision.  Respiratory:  Negative for cough, shortness of breath and wheezing.   Cardiovascular:  Negative for chest pain, palpitations and leg swelling.  Gastrointestinal:  Negative for abdominal pain, blood in stool, constipation, diarrhea, heartburn, melena, nausea and vomiting.  Genitourinary:  Negative for dysuria, flank pain, frequency, hematuria and urgency.       Positive for vaginal irritation and odor  Musculoskeletal:  Negative for back pain, joint pain and myalgias.  Skin:  Negative for itching and rash.  Neurological:  Negative for dizziness, tingling, tremors, sensory change, speech change, focal weakness, seizures, loss of consciousness, weakness and headaches.  Endo/Heme/Allergies:  Negative for environmental allergies. Does not bruise/bleed easily.       Positive for irregular menstrual bleeding  Psychiatric/Behavioral:  Negative for depression, hallucinations, memory loss, substance abuse and suicidal ideas. The patient is not nervous/anxious and does not have insomnia.       Objective:  Objective   Vitals:   11/05/23 1113  BP: 91/64  Pulse: 91  Weight: 98 lb (44.5 kg)  Height: 5' (1.524 m)   Body mass index is 19.14 kg/m. Constitutional: Well nourished, well developed female in no acute distress.  HEENT: normal Skin: Warm  and dry.  Cardiovascular: Regular rate and rhythm.   Extremity:  no edema   Respiratory:  Normal respiratory effort Psych: Alert and Oriented x3. No memory deficits. Normal mood and affect.    Pelvic exam:  is not limited by body habitus EGBUS: within normal limits Vagina: within normal limits and with normal mucosa, blood on aptima swab   Assessment/Plan:     18 y.o. G0 P0 female with irregular menstrual bleeding due to inconsistency of using hormonal patch. Symptoms of  BV.  Depo injection today Aptima vaginitis collected   Tresea Mall, CNM [facility] 11/05/2023 12:39 PM

## 2023-11-07 ENCOUNTER — Encounter: Payer: Self-pay | Admitting: Advanced Practice Midwife

## 2023-11-07 ENCOUNTER — Other Ambulatory Visit: Payer: Self-pay | Admitting: Advanced Practice Midwife

## 2023-11-07 DIAGNOSIS — B3731 Acute candidiasis of vulva and vagina: Secondary | ICD-10-CM

## 2023-11-07 DIAGNOSIS — B9689 Other specified bacterial agents as the cause of diseases classified elsewhere: Secondary | ICD-10-CM

## 2023-11-07 LAB — CERVICOVAGINAL ANCILLARY ONLY
Bacterial Vaginitis (gardnerella): POSITIVE — AB
Candida Glabrata: NEGATIVE
Candida Vaginitis: POSITIVE — AB
Comment: NEGATIVE
Comment: NEGATIVE
Comment: NEGATIVE

## 2023-11-07 MED ORDER — METRONIDAZOLE 500 MG PO TABS: 500.0000 mg | ORAL_TABLET | Freq: Two times a day (BID) | ORAL | 0 refills | Status: AC

## 2023-11-07 MED ORDER — FLUCONAZOLE 150 MG PO TABS: 150.0000 mg | ORAL_TABLET | ORAL | 0 refills | Status: DC

## 2023-11-07 NOTE — Progress Notes (Signed)
 Rx metronidazole and diflucan sent to treat BV and yeast. Message to patient.

## 2024-01-26 ENCOUNTER — Ambulatory Visit: Admitting: Family Medicine

## 2024-01-26 ENCOUNTER — Encounter: Payer: Self-pay | Admitting: Family Medicine

## 2024-01-26 VITALS — BP 114/82 | HR 65 | Temp 98.5°F | Resp 20 | Ht 60.0 in | Wt 97.6 lb

## 2024-01-26 DIAGNOSIS — T7840XD Allergy, unspecified, subsequent encounter: Secondary | ICD-10-CM

## 2024-01-26 DIAGNOSIS — L750 Bromhidrosis: Secondary | ICD-10-CM | POA: Insufficient documentation

## 2024-01-26 DIAGNOSIS — R5383 Other fatigue: Secondary | ICD-10-CM | POA: Diagnosis not present

## 2024-01-26 DIAGNOSIS — J309 Allergic rhinitis, unspecified: Secondary | ICD-10-CM | POA: Insufficient documentation

## 2024-01-26 MED ORDER — CETIRIZINE HCL 10 MG PO TABS: 10.0000 mg | ORAL_TABLET | Freq: Every day | ORAL | 11 refills | Status: DC

## 2024-01-26 MED ORDER — OLOPATADINE HCL 0.1 % OP SOLN: 1.0000 [drp] | Freq: Two times a day (BID) | OPHTHALMIC | 12 refills | Status: DC

## 2024-01-26 MED ORDER — OLOPATADINE HCL 0.1 % OP SOLN: 1.0000 [drp] | Freq: Two times a day (BID) | OPHTHALMIC | Status: DC

## 2024-01-26 NOTE — Assessment & Plan Note (Addendum)
 Discussed that it is normal to have musky axillary smell with sweating.  Discussed antiperspirants versus deodorants.  Encouraged her to try different over-the-counter products.  Discussed Lumi but she wants to try what is available in the stores.

## 2024-01-26 NOTE — Assessment & Plan Note (Signed)
 Increase your Zyrtec to 10 mg daily.  Patanol drops every 12 hours OU.

## 2024-01-26 NOTE — Progress Notes (Signed)
 New Patient Office Visit  Subjective    Patient ID: Leslie Barry, female    DOB: [birth year]  Age: 18 y.o. MRN: [id number]  CC:  Chief Complaint  Patient presents with   Dry Eye   Rash    Right side of neck    HPI Leslie Barry presents to establish care. Delightful 18 year old with intermittent chest pain.  She feels pain on the left side of her upper chest and it is painful to press in this area.  She denies any SOB.  Her chest has been painful for several weeks now.  She was clearing her throat a lot and thinks she hurt her chest when she was doing that.  She has been taking Tylenol and that has not helped.  She tried ibuprofen and that does not help.  Another provider recommended she try Aleve but she has not.  She reports she has never had asthma and never used an albuterol inhaler or any inhaler.   Her eyes feel dry and itch she has not taken any medications for them she does sneeze frequently and she has clear rhinorrhea.  She has been taking Zyrtec 5 mg and it has not been helpful.  Zyrtec does not help with the nasal congestion or rhinorrhea and it does not help her eyes. She is complaining of having axillary sweating and a musty odor.  It is just started in the last year she thinks. Patient mentions that she is tired all the time.  She "wakes up tired".  She denies waking up gasping.  She has hx of DUB from irregular usage of OC patches.  She reports that she was told to take iron pills by OB/GYN.  She is not currently taking iron.  She does not eat meat daily.  Offered to work up her symptom of fatigue and mentioned blood work but she refused any blood work.   Outpatient Encounter Medications as of 01/26/2024  Medication Sig   cetirizine (ZYRTEC) 10 MG tablet Take 1 tablet (10 mg total) by mouth daily.   ipratropium (ATROVENT ) 0.03 % nasal spray Place 2 sprays into both nostrils every 12 (twelve) hours.   olopatadine ([facility]S OLOPATADINE HCL) 0.1 % ophthalmic solution Place 1 drop  into both eyes 2 (two) times daily.   triamcinolone  ointment (KENALOG ) 0.1 % Apply topically 2 (two) times daily as needed.   [DISCONTINUED] cetirizine (ZYRTEC) 10 MG tablet Take 1 tablet (10 mg total) by mouth daily.   [DISCONTINUED] cetirizine (ZYRTEC) 5 MG tablet Take 5 mg by mouth daily.   fluconazole  (DIFLUCAN ) 150 MG tablet Take 1 tablet (150 mg total) by mouth every 3 (three) days. (Patient not taking: Reported on 01/26/2024)   [DISCONTINUED] olopatadine (PATANOL) 0.1 % ophthalmic solution 1 drop    No facility-administered encounter medications on file as of 01/26/2024.    Past Medical History:  Diagnosis Date   Eczema     Past Surgical History:  Procedure Laterality Date   WISDOM TOOTH EXTRACTION      Family History  Problem Relation Age of Onset   Breast cancer Mother 51   Breast cancer Maternal Grandmother 48    Social History   Socioeconomic History   Marital status: Single    Spouse name: Not on file   Number of children: Not on file   Years of education: Not on file   Highest education level: Not on file  Occupational History   Not on file  Tobacco Use   Smoking status:  Former    Types: E-cigarettes    Start date: 09/17/2019    Quit date: 04/16/2022    Years since quitting: 1.7   Smokeless tobacco: Never  Vaping Use   Vaping status: Former   Start date: 09/17/2019   Quit date: 04/16/2022  Substance and Sexual Activity   Alcohol  use: Not Currently   Drug use: Not Currently   Sexual activity: Not Currently    Birth control/protection: Patch  Other Topics Concern   Not on file  Social History Narrative   ** Merged History Encounter **       Social Drivers of Corporate investment banker Strain: Not on file  Food Insecurity: Not on file  Transportation Needs: Not on file  Physical Activity: Not on file  Stress: Not on file  Social Connections: Not on file  Intimate Partner Violence: Not on file    ROS      Objective   BP 114/82 (BP Location:  Left Arm, Patient Position: Sitting, Cuff Size: Normal)   Pulse 65   Temp 98.5 F (36.9 C) (Oral)   Resp 20   Ht 5' (1.524 m)   Wt 97 lb 9.6 oz (44.3 kg)   LMP  (LMP Unknown)   SpO2 99%   BMI 19.06 kg/m    Physical Exam Vitals and nursing note reviewed.  Constitutional:      Appearance: Normal appearance.  HENT:     Head: Normocephalic and atraumatic.     Right Ear: Tympanic membrane, ear canal and external ear normal. There is no impacted cerumen.     Left Ear: Tympanic membrane, ear canal and external ear normal. There is no impacted cerumen.     Nose:     Right Turbinates: Swollen and pale.     Left Turbinates: Swollen and pale.     Mouth/Throat:     Mouth: Mucous membranes are moist.     Pharynx: Oropharynx is clear.     Comments: Posterior oropharynx positive cobblestoning Eyes:     Conjunctiva/sclera: Conjunctivae normal.     Comments: Inner lower lids are injected bilaterally  Cardiovascular:     Rate and Rhythm: Normal rate and regular rhythm.  Pulmonary:     Effort: Pulmonary effort is normal.     Breath sounds: Normal breath sounds.  Musculoskeletal:     Cervical back: Neck supple. No tenderness.  Lymphadenopathy:     Cervical: No cervical adenopathy.  Skin:    General: Skin is warm and dry.  Neurological:     Mental Status: She is alert and oriented to person, place, and time.  Psychiatric:        Mood and Affect: Mood normal.        Behavior: Behavior normal.        Thought Content: Thought content normal.        Judgment: Judgment normal.            The ASCVD Risk score (Arnett DK, et al., 2019) failed to calculate for the following reasons:   The 2019 ASCVD risk score is only valid for ages 48 to 74     Assessment & Plan:  Allergy, subsequent encounter -     Cetirizine HCl; Take 1 tablet (10 mg total) by mouth daily.  Dispense: 30 tablet; Refill: 11 -     Olopatadine HCl; Place 1 drop into both eyes 2 (two) times daily.  Dispense: 5 mL;  Refill: 12  Other fatigue Assessment & Plan: Offered  to work this up including blood work and she declined.  She has a history of being told to take iron pills.  Encouraged her to eat meat daily.  Encouraged her to take an iron pill with vitamin C daily.   Allergic rhinitis, unspecified seasonality, unspecified trigger Assessment & Plan: Increase your Zyrtec to 10 mg daily.  Patanol drops every 12 hours OU.   Body odor Assessment & Plan: Discussed that it is normal to have musky axillary smell with sweating.  Discussed antiperspirants versus deodorants.  Encouraged her to try different over-the-counter products.  Discussed Lumi but she wants to try what is available in the stores.       Return in about 4 weeks (around 02/23/2024), or if symptoms worsen or fail to improve.   Cosme Jacob K Dagmar Adcox, MD

## 2024-01-26 NOTE — Assessment & Plan Note (Signed)
 Offered to work this up including blood work and she declined.  She has a history of being told to take iron pills.  Encouraged her to eat meat daily.  Encouraged her to take an iron pill with vitamin C daily.

## 2024-01-29 ENCOUNTER — Encounter: Payer: Self-pay | Admitting: Family Medicine

## 2024-01-30 ENCOUNTER — Other Ambulatory Visit: Payer: Self-pay | Admitting: Family Medicine

## 2024-01-30 DIAGNOSIS — J309 Allergic rhinitis, unspecified: Secondary | ICD-10-CM

## 2024-01-30 MED ORDER — LORATADINE 10 MG PO TABS: 10.0000 mg | ORAL_TABLET | Freq: Every day | ORAL | 3 refills | Status: DC

## 2024-02-06 ENCOUNTER — Ambulatory Visit: Payer: Medicaid Other | Admitting: Advanced Practice Midwife

## 2024-02-17 ENCOUNTER — Encounter: Payer: Self-pay | Admitting: Advanced Practice Midwife

## 2024-02-17 ENCOUNTER — Ambulatory Visit (INDEPENDENT_AMBULATORY_CARE_PROVIDER_SITE_OTHER): Admitting: Advanced Practice Midwife

## 2024-02-17 VITALS — BP 109/75 | HR 68 | Ht 60.0 in | Wt 94.6 lb

## 2024-02-17 DIAGNOSIS — R21 Rash and other nonspecific skin eruption: Secondary | ICD-10-CM | POA: Diagnosis not present

## 2024-02-17 DIAGNOSIS — Z3042 Encounter for surveillance of injectable contraceptive: Secondary | ICD-10-CM

## 2024-02-17 MED ORDER — MEDROXYPROGESTERONE ACETATE 150 MG/ML IM SUSP
150.0000 mg | Freq: Once | INTRAMUSCULAR | Status: AC
  Administered 2024-02-17: 150 mg via INTRAMUSCULAR

## 2024-02-17 NOTE — Patient Instructions (Signed)
 Rash, Adult A rash is a breakout of spots or blotches on the skin. It can affect the way the skin looks and feels. Many things can cause a rash. Common causes include: Viral infections. These include colds, measles, and hand, foot, and mouth disease. Bacterial infections. These include scarlet fever and impetigo. Fungal infections. These include athlete's foot, ringworm, and yeast rashes. Skin irritation. This may be from heat rash, exposure to moisture or friction for a long time (intertrigo), or exposure to soap or skin care products (eczema). Allergic reactions. These may be caused by foods, medicines, or things like poison ivy. Some rashes may go away after a few days. Others may last for a few weeks. The goal of treatment is to stop the itching and keep the rash from spreading. Follow these instructions at home: Medicine Take or apply over-the-counter and prescription medicines only as told by your health care provider. These may include: Corticosteroids. These can help treat red or swollen skin. They may be given as creams or as medicines to take by mouth (orally). Anti-itch lotions. Allergy medicines. Pain medicine. Antifungal medicine if the rash is from a fungal infection. Antibiotics if you have an infection.  Skin care Apply cool, wet cloths (compresses) to the affected areas. Do not scratch or rub your skin. Avoid covering the rash. Keep it exposed to air as often as you can. Managing itching and discomfort Avoid hot showers and baths. These can make itching worse. A cold shower may help. Try taking a bath with: Epsom salts. You can get these at your local pharmacy or grocery store. Follow the instructions on the package. Baking soda. Pour a small amount into the bath as told by your provider. Colloidal oatmeal. You can get this at your local pharmacy or grocery store. Follow the instructions on the package. Try putting baking soda paste on your skin. Stir water into baking  soda until it becomes like a paste. Try using calamine lotion or cortisone cream to help with itchiness. Keep cool. Stay out of the sun. Sweating and being hot can make itching worse. General instructions  Rest as needed. Drink enough fluid to keep your pee (urine) pale yellow. Wear loose-fitting clothes. Avoid scented soaps, detergents, and perfumes. Use gentle soaps, detergents, perfumes, and cosmetics. Avoid the things that cause your rash (triggers). Keep a journal to help keep track of your triggers. Write down: What you eat. What cosmetics you use. What you drink. What you wear. This includes jewelry. Contact a health care provider if: You sweat at night more than normal. You pee (urinate) more or less than normal, or your pee is a darker color than normal. Your eyes become sensitive to light. Your skin or the white parts of your eyes turn yellow (jaundice). Your skin tingles or is numb. You get painful blisters in your nose or mouth. Your rash does not go away after a few days, or it gets worse. You are more tired or thirsty than normal. You have new or worse symptoms. These may include: Pain in your abdomen. Fever. Diarrhea or vomiting. Weakness or weight loss. Get help right away if: You get confused. You have a severe headache, a stiff neck, or severe joint pain or stiffness. You become very sleepy or not responsive. You have a seizure. This information is not intended to replace advice given to you by your health care provider. Make sure you discuss any questions you have with your health care provider. Document Revised: 06/21/2022 Document Reviewed:  06/21/2022 Elsevier Patient Education  2024 [employer].

## 2024-02-17 NOTE — Progress Notes (Signed)
 Patient ID: Leslie Barry, female   DOB: 05-14-2006, 18 y.o.   MRN: [id number]  Reason for Visit: Depo Shot and Breast Rash   Subjective:  HPI:  Leslie Barry is a 18 y.o. female in the office for next Depo injection. She was due for injection 2 weeks ago. Denies unprotected intercourse since April. No bleeding since initiating Depo. No problems with BV.  She also mentions an itchy rash on her bilateral outer breasts that she has had since January. She did change soaps from Mason to Dial. Doesn't usually wear a bra. Has not tried any other remedies.  Past Medical History:  Diagnosis Date   Eczema    Family History  Problem Relation Age of Onset   Breast cancer Mother 10   Breast cancer Maternal Grandmother 54   Past Surgical History:  Procedure Laterality Date   WISDOM TOOTH EXTRACTION      Short Social History:  Social History   Tobacco Use   Smoking status: Former    Types: E-cigarettes    Start date: 09/17/2019    Quit date: 04/16/2022    Years since quitting: 1.8   Smokeless tobacco: Never  Substance Use Topics   Alcohol  use: Not Currently    No Known Allergies  Current Outpatient Medications  Medication Sig Dispense Refill   ipratropium (ATROVENT ) 0.03 % nasal spray Place 2 sprays into both nostrils every 12 (twelve) hours. 30 mL 2   olopatadine  (CVS OLOPATADINE  HCL) 0.1 % ophthalmic solution Place 1 drop into both eyes 2 (two) times daily. 5 mL 12   triamcinolone  ointment (KENALOG ) 0.1 % Apply topically 2 (two) times daily as needed.     cetirizine  (ZYRTEC ) 10 MG tablet Take 1 tablet (10 mg total) by mouth daily. (Patient not taking: Reported on 02/17/2024) 30 tablet 11   fluconazole  (DIFLUCAN ) 150 MG tablet Take 1 tablet (150 mg total) by mouth every 3 (three) days. (Patient not taking: Reported on 02/17/2024) 2 tablet 0   loratadine  (CLARITIN ) 10 MG tablet Take 1 tablet (10 mg total) by mouth daily. (Patient not taking: Reported on 02/17/2024) 90 tablet 3   Current  Facility-Administered Medications  Medication Dose Route Frequency Provider Last Rate Last Admin   medroxyPROGESTERone  (DEPO-PROVERA ) injection 150 mg  150 mg Intramuscular Once Keisi Eckford, CNM        Review of Systems  Constitutional:  Negative for chills and fever.  HENT:  Negative for congestion, ear discharge, ear pain, hearing loss, sinus pain and sore throat.   Eyes:  Negative for blurred vision and double vision.  Respiratory:  Negative for cough, shortness of breath and wheezing.   Cardiovascular:  Negative for chest pain, palpitations and leg swelling.  Gastrointestinal:  Negative for abdominal pain, blood in stool, constipation, diarrhea, heartburn, melena, nausea and vomiting.  Genitourinary:  Negative for dysuria, flank pain, frequency, hematuria and urgency.  Musculoskeletal:  Negative for back pain, joint pain and myalgias.  Skin:  Positive for itching and rash.  Neurological:  Negative for dizziness, tingling, tremors, sensory change, speech change, focal weakness, seizures, loss of consciousness, weakness and headaches.  Endo/Heme/Allergies:  Negative for environmental allergies. Does not bruise/bleed easily.  Psychiatric/Behavioral:  Negative for depression, hallucinations, memory loss, substance abuse and suicidal ideas. The patient is not nervous/anxious and does not have insomnia.         Objective:  Objective   Vitals:   02/17/24 1555  BP: 109/75  Pulse: 68  Weight: 94 lb 9.6 oz (42.9  kg)  Height: 5' (1.524 m)   Body mass index is 18.48 kg/m. Constitutional: Well nourished, well developed female in no acute distress.  HEENT: normal Skin: Warm and dry.  Breast: faint, very slightly raised, pinpoint diffuse rash on outer aspects of both breasts Cardiovascular: Regular rate and rhythm.   Respiratory:  Normal respiratory effort Psych: Alert and Oriented x3. No memory deficits. Flat affect.    Assessment/Plan:     18 y.o. G0 P0 female contraceptive  surveillance, skin rash of breasts  Use gentle, non-chemical skin care products Cortisone or benadryl cream to affected areas Anti-itch lotion such as Aveeno or Gold Bond Return for worsening symptoms   Angelita Kendall, CNM [facility] 02/17/2024 4:39 PM

## 2024-02-27 ENCOUNTER — Ambulatory Visit: Admitting: Family Medicine

## 2024-03-09 ENCOUNTER — Ambulatory Visit (INDEPENDENT_AMBULATORY_CARE_PROVIDER_SITE_OTHER): Admitting: Family Medicine

## 2024-03-09 ENCOUNTER — Encounter: Payer: Self-pay | Admitting: Family Medicine

## 2024-03-09 VITALS — BP 110/78 | HR 89 | Temp 98.8°F | Resp 22 | Ht 60.0 in | Wt 95.0 lb

## 2024-03-09 DIAGNOSIS — F32A Depression, unspecified: Secondary | ICD-10-CM | POA: Diagnosis not present

## 2024-03-09 DIAGNOSIS — F419 Anxiety disorder, unspecified: Secondary | ICD-10-CM

## 2024-03-09 DIAGNOSIS — J309 Allergic rhinitis, unspecified: Secondary | ICD-10-CM

## 2024-03-09 DIAGNOSIS — R5383 Other fatigue: Secondary | ICD-10-CM

## 2024-03-09 DIAGNOSIS — L309 Dermatitis, unspecified: Secondary | ICD-10-CM | POA: Insufficient documentation

## 2024-03-09 DIAGNOSIS — F321 Major depressive disorder, single episode, moderate: Secondary | ICD-10-CM | POA: Diagnosis not present

## 2024-03-09 MED ORDER — LORATADINE 10 MG PO TABS
10.0000 mg | ORAL_TABLET | Freq: Every day | ORAL | 3 refills | Status: DC
Start: 2024-03-09 — End: 2024-04-09

## 2024-03-09 MED ORDER — TRIAMCINOLONE ACETONIDE 0.1 % EX CREA: 1.0000 | TOPICAL_CREAM | Freq: Two times a day (BID) | CUTANEOUS | 0 refills | Status: DC

## 2024-03-09 MED ORDER — TRIAMCINOLONE 0.1 % CREAM:EUCERIN CREAM 1:1
1.0000 | TOPICAL_CREAM | Freq: Two times a day (BID) | CUTANEOUS | 1 refills | Status: DC
Start: 2024-03-09 — End: 2024-03-09

## 2024-03-09 MED ORDER — EUCRISA 2 % EX OINT: TOPICAL_OINTMENT | CUTANEOUS | 1 refills | Status: DC

## 2024-03-09 NOTE — Progress Notes (Signed)
 Established Patient Office Visit  Subjective   Patient ID: Leslie Barry, female    DOB: 06/12/2006  Age: 18 y.o. MRN: [id number]  Chief Complaint  Patient presents with   Eczema   Fatigue    HPI Delightful 18 year old with eczema on the flexor surfaces of her arms and legs.  She is using triamcinolone  ointment and does not find it effective.  She likes the cream better.  She also has eczema on the back of her neck.  She needs a refill of medications.  She has never tried anything other than triamcinolone , no tacrolimus or Eucrisa. She reports that she is tired all the time she denies shortness of breath with ambulation or chest pain.  She no longer has menstrual periods because she is on the Depo-Provera .  PHQ-9 score is 12 and GAD-7 score is 12.  She admits that she is having anxiety for the past year and that she feels depressed.  She denies any thoughts of self-harm or harm to others.  Her job is not going very well she is not making enough money and that is one of the things is cut her down.  She also does not leave the house very much and is not doing anything social right now.  She denies having fear of leaving the home.    ROS    Objective:     BP 110/78 (BP Location: Left Arm, Patient Position: Sitting, Cuff Size: Small)   Pulse 89   Temp 98.8 F (37.1 C) (Oral)   Resp (!) 22   Ht 5' (1.524 m)   Wt 95 lb (43.1 kg)   SpO2 99%   BMI 18.55 kg/m    Physical Exam Vitals and nursing note reviewed.  Constitutional:      Appearance: Normal appearance.  HENT:     Head: Normocephalic and atraumatic.   Eyes:     Conjunctiva/sclera: Conjunctivae normal.    Cardiovascular:     Rate and Rhythm: Normal rate and regular rhythm.  Pulmonary:     Effort: Pulmonary effort is normal.     Breath sounds: Normal breath sounds.   Musculoskeletal:     Right lower leg: No edema.     Left lower leg: No edema.   Skin:    General: Skin is warm and dry.     Findings: Rash (Fine  papular rash flexor surfaces of the arms and legs and posterior neck.) present.   Neurological:     Mental Status: She is alert and oriented to person, place, and time.   Psychiatric:        Mood and Affect: Mood normal.        Behavior: Behavior normal.        Thought Content: Thought content normal.        Judgment: Judgment normal.          No results found for any visits on 03/09/24.    The ASCVD Risk score (Arnett DK, et al., 2019) failed to calculate for the following reasons:   The 2019 ASCVD risk score is only valid for ages 69 to 57    Assessment & Plan:  Other fatigue Assessment & Plan: Will check thyroid function, liver and kidneys, cell counts and B12 for her fatigue.  Am concerned that her fatigue may be a symptom of anxiety and depression however.  Orders: -     TSH + free T4 -     CMP14+EGFR -     CBC  with Differential/Platelet -     Vitamin B12  Allergic rhinitis, unspecified seasonality, unspecified trigger -     Loratadine ; Take 1 tablet (10 mg total) by mouth daily.  Dispense: 90 tablet; Refill: 3  Eczema, unspecified type -     Loratadine ; Take 1 tablet (10 mg total) by mouth daily.  Dispense: 90 tablet; Refill: 3 -     Eucrisa; Apply a small amount to eczema twice a day and thoroughly rub in  Dispense: 100 g; Refill: 1 -     Triamcinolone  Acetonide; Apply 1 Application topically 2 (two) times daily.  Dispense: 30 g; Refill: 0  Depression, major, single episode, moderate (HCC) -     Ambulatory referral to Psychology  Anxiety and depression Assessment & Plan: PHQ-9 score is 12 and GAD-7 score is 12.  She denies thoughts of harm to self or others.  She is not interested in taking medication but would like to talk to a therapist.  Will refer to behavioral health.      Return in about 4 weeks (around 04/06/2024) for mood disorder.    Alasha Mcguinness K Yuji Walth, MD

## 2024-03-09 NOTE — Assessment & Plan Note (Signed)
 Will check thyroid function, liver and kidneys, cell counts and B12 for her fatigue.  Am concerned that her fatigue may be a symptom of anxiety and depression however.

## 2024-03-09 NOTE — Assessment & Plan Note (Signed)
 PHQ-9 score is 12 and GAD-7 score is 12.  She denies thoughts of harm to self or others.  She is not interested in taking medication but would like to talk to a therapist.  Will refer to behavioral health.

## 2024-03-09 NOTE — Assessment & Plan Note (Signed)
 Trying to get her Elbert so that she can control her eczema without using a steroid.  If your insurance does not cover this there is the triamcinolone  acetate cream you can use sparingly twice a day.

## 2024-03-10 ENCOUNTER — Ambulatory Visit: Payer: Self-pay | Admitting: Family Medicine

## 2024-03-10 LAB — CBC WITH DIFFERENTIAL/PLATELET
Basophils Absolute: 0 10*3/uL (ref 0.0–0.2)
Basos: 1 %
EOS (ABSOLUTE): 0.2 10*3/uL (ref 0.0–0.4)
Eos: 6 %
Hematocrit: 45.7 % (ref 34.0–46.6)
Hemoglobin: 14.2 g/dL (ref 11.1–15.9)
Immature Grans (Abs): 0 10*3/uL (ref 0.0–0.1)
Immature Granulocytes: 0 %
Lymphocytes Absolute: 1.4 10*3/uL (ref 0.7–3.1)
Lymphs: 42 %
MCH: 27.3 pg (ref 26.6–33.0)
MCHC: 31.1 g/dL — ABNORMAL LOW (ref 31.5–35.7)
MCV: 88 fL (ref 79–97)
Monocytes Absolute: 0.2 10*3/uL (ref 0.1–0.9)
Monocytes: 6 %
Neutrophils Absolute: 1.5 10*3/uL (ref 1.4–7.0)
Neutrophils: 45 %
Platelets: 282 10*3/uL (ref 150–450)
RBC: 5.2 x10E6/uL (ref 3.77–5.28)
RDW: 12.2 % (ref 11.7–15.4)
WBC: 3.3 10*3/uL — ABNORMAL LOW (ref 3.4–10.8)

## 2024-03-10 LAB — VITAMIN B12: Vitamin B-12: 433 pg/mL (ref 232–1245)

## 2024-03-10 LAB — CMP14+EGFR
ALT: 11 IU/L (ref 0–32)
AST: 16 IU/L (ref 0–40)
Albumin: 4.4 g/dL (ref 4.0–5.0)
Alkaline Phosphatase: 69 IU/L (ref 42–106)
BUN/Creatinine Ratio: 8 — ABNORMAL LOW (ref 9–23)
BUN: 6 mg/dL (ref 6–20)
Bilirubin Total: 0.6 mg/dL (ref 0.0–1.2)
CO2: 19 mmol/L — ABNORMAL LOW (ref 20–29)
Calcium: 9.6 mg/dL (ref 8.7–10.2)
Chloride: 105 mmol/L (ref 96–106)
Creatinine, Ser: 0.76 mg/dL (ref 0.57–1.00)
Globulin, Total: 2.8 g/dL (ref 1.5–4.5)
Glucose: 81 mg/dL (ref 70–99)
Potassium: 3.9 mmol/L (ref 3.5–5.2)
Sodium: 142 mmol/L (ref 134–144)
Total Protein: 7.2 g/dL (ref 6.0–8.5)
eGFR: 115 mL/min/{1.73_m2} (ref >59)

## 2024-03-10 LAB — TSH+FREE T4
Free T4: 1.2 ng/dL (ref 0.82–1.77)
TSH: 1.24 u[IU]/mL (ref 0.450–4.500)

## 2024-03-10 NOTE — Telephone Encounter (Signed)
 Copied from CRM [id number]. Topic: General - Other >> Mar 09, 2024  4:03 PM Avram MATSU wrote: Reason for CRM: patient recently 6/24 had blood work done and requesting to test for [employer]

## 2024-04-07 ENCOUNTER — Ambulatory Visit: Payer: Self-pay

## 2024-04-07 NOTE — Telephone Encounter (Signed)
         FYI Only or Action Required?: FYI only for provider.  Patient was last seen in primary care on 03/09/2024 by Ziglar, Susan K, MD.  Called Nurse Triage reporting Appointment.  Symptoms began refused triage.  Interventions attempted: Other: refused triage.  Symptoms are: refused triage.  Triage Disposition: appointment only -  Pt very curt. Wanted appt changed. Reschedule for Friday.  Patient/caregiver understands and will follow disposition?: Copied from CRM [id number]. Topic: Clinical - Red Word Triage >> Apr 07, 2024  8:38 AM Elle L wrote: Red Word that prompted transfer to Nurse Triage: The patient called to reschedule her appointment for a later time tomorrow. However, she is having worsening ezcema patches that is causing her pain and discomfort. Reason for Disposition  Requesting regular office appointment  Answer Assessment - Initial Assessment Questions 1. REASON FOR CALL: What is the main reason for your call? or How can I best help you?     Change appt 2. SYMPTOMS : Do you have any symptoms?      Eczema patches  Protocols used: Information Only Call - No Triage-A-AH

## 2024-04-08 ENCOUNTER — Ambulatory Visit: Admitting: Family Medicine

## 2024-04-09 ENCOUNTER — Ambulatory Visit (INDEPENDENT_AMBULATORY_CARE_PROVIDER_SITE_OTHER): Admitting: Family Medicine

## 2024-04-09 ENCOUNTER — Encounter: Payer: Self-pay | Admitting: Family Medicine

## 2024-04-09 VITALS — BP 117/81 | HR 75 | Temp 98.1°F | Resp 16 | Ht 60.0 in | Wt 97.0 lb

## 2024-04-09 DIAGNOSIS — F419 Anxiety disorder, unspecified: Secondary | ICD-10-CM | POA: Diagnosis not present

## 2024-04-09 DIAGNOSIS — L2082 Flexural eczema: Secondary | ICD-10-CM | POA: Diagnosis not present

## 2024-04-09 DIAGNOSIS — J309 Allergic rhinitis, unspecified: Secondary | ICD-10-CM | POA: Diagnosis not present

## 2024-04-09 DIAGNOSIS — L309 Dermatitis, unspecified: Secondary | ICD-10-CM

## 2024-04-09 DIAGNOSIS — F32A Depression, unspecified: Secondary | ICD-10-CM

## 2024-04-09 MED ORDER — LORATADINE 10 MG PO TABS: 10.0000 mg | ORAL_TABLET | Freq: Every day | ORAL | 3 refills | Status: AC

## 2024-04-09 NOTE — Assessment & Plan Note (Signed)
 She is on Claritin  10 mg daily and triamcinolone  0.1% applied twice daily.  Her eczema is worse.  She asked for referral to dermatology for more definitive treatment

## 2024-04-09 NOTE — Assessment & Plan Note (Signed)
 She does not want to take medication.  She would prefer cognitive behavioral therapy.  Got her the name address and phone number of the psychology referral.

## 2024-04-09 NOTE — Progress Notes (Signed)
 Established Patient Office Visit  Subjective   Patient ID: Leslie Barry, female    DOB: 2006-03-23  Age: 18 y.o. MRN: [id number]  Chief Complaint  Patient presents with   Medical Management of Chronic Issues    follow up    Eczema    Eczema is worse. Would like to see a dermatologist. Mostly on arms, back, and between legs and both knees.    HPI Delightful 18 year old with eczema and anxiety/depression.  She did not want to try medication but wanted to go to therapy.  She has not heard back from her therapy referral.  She still does not want to try medications.      Her eczema is a lot worse and is primarily located on the flexor surfaces of her arms and the back of her neck.  Her insurance would not allow her to have Eucrisa .  She has been using triamcinolone  0.1% twice daily and she takes Claritin  10 mg daily.  She would like to see dermatology for more definitive treatment.     Objective:     BP 117/81   Pulse 75   Temp 98.1 F (36.7 C) (Oral)   Resp 16   Ht 5' (1.524 m)   Wt 97 lb (44 kg)   LMP 10/18/2023 (Approximate)   SpO2 99%   BMI 18.94 kg/m    Physical Exam Vitals reviewed.  Constitutional:      Appearance: Normal appearance.  HENT:     Head: Normocephalic.  Eyes:     General:        Right eye: No discharge.        Left eye: No discharge.  Cardiovascular:     Rate and Rhythm: Normal rate.  Pulmonary:     Effort: Pulmonary effort is normal.  Skin:    General: Skin is warm.     Findings: Rash (Eczema flexor surfaces bilateral arms.) present.  Neurological:     Mental Status: She is alert and oriented to person, place, and time.  Psychiatric:        Mood and Affect: Mood normal.        Behavior: Behavior normal.        Thought Content: Thought content normal.        Judgment: Judgment normal.          No results found for any visits on 04/09/24.    The ASCVD Risk score (Arnett DK, et al., 2019) failed to calculate for the following  reasons:   The 2019 ASCVD risk score is only valid for ages 20 to 60    Assessment & Plan:  Flexural eczema Assessment & Plan: She is on Claritin  10 mg daily and triamcinolone  0.1% applied twice daily.  Her eczema is worse.  She asked for referral to dermatology for more definitive treatment  Orders: -     Ambulatory referral to Dermatology  Allergic rhinitis, unspecified seasonality, unspecified trigger -     Loratadine ; Take 1 tablet (10 mg total) by mouth daily.  Dispense: 90 tablet; Refill: 3  Eczema, unspecified type Assessment & Plan: She is on Claritin  10 mg daily and triamcinolone  0.1% applied twice daily.  Her eczema is worse.  She asked for referral to dermatology for more definitive treatment  Orders: -     Loratadine ; Take 1 tablet (10 mg total) by mouth daily.  Dispense: 90 tablet; Refill: 3  Anxiety and depression Assessment & Plan: She does not want to take medication.  She  would prefer cognitive behavioral therapy.  Got her the name address and phone number of the psychology referral.      Return if symptoms worsen or fail to improve.    Itzy Adler K Ceri Mayer, MD

## 2024-04-18 ENCOUNTER — Encounter: Payer: Self-pay | Admitting: Advanced Practice Midwife

## 2024-04-25 ENCOUNTER — Emergency Department
Admission: EM | Admit: 2024-04-25 | Discharge: 2024-04-25 | Disposition: A | Discharge status: Home / Self Care | Attending: Emergency Medicine | Admitting: Emergency Medicine

## 2024-04-25 ENCOUNTER — Encounter: Payer: Self-pay | Admitting: *Deleted

## 2024-04-25 ENCOUNTER — Other Ambulatory Visit: Payer: Self-pay

## 2024-04-25 DIAGNOSIS — R079 Chest pain, unspecified: Secondary | ICD-10-CM | POA: Diagnosis present

## 2024-04-25 DIAGNOSIS — R0789 Other chest pain: Secondary | ICD-10-CM | POA: Diagnosis not present

## 2024-04-25 MED ORDER — TIZANIDINE HCL 2 MG PO TABS: 2.0000 mg | ORAL_TABLET | Freq: Three times a day (TID) | ORAL | 0 refills | Status: AC | PRN

## 2024-04-25 MED ORDER — LIDOCAINE 5 % EX PTCH: 1.0000 | MEDICATED_PATCH | Freq: Two times a day (BID) | CUTANEOUS | 0 refills | Status: AC | PRN

## 2024-04-25 MED ORDER — MELOXICAM 7.5 MG PO TABS: 7.5000 mg | ORAL_TABLET | Freq: Every day | ORAL | 0 refills | Status: AC

## 2024-04-25 MED ORDER — LIDOCAINE 5 % EX PTCH
1.0000 | MEDICATED_PATCH | Freq: Once | CUTANEOUS | Status: DC
  Administered 2024-04-25: 1 via TRANSDERMAL
  Filled 2024-04-25: qty 1

## 2024-04-25 NOTE — ED Triage Notes (Signed)
 Pt is here for left sided chest pain which increases with deep breathing and movement.  Pt states that this has been ongoing for over a year.  She was seen by MD in February and had a negative work up. Pt is hesitant about getting a EKG and refuses blood work in triage. No sob

## 2024-04-25 NOTE — ED Provider Notes (Signed)
 [facility] Emergency Department Provider Note     Event Date/Time   First MD Initiated Contact with Patient 04/25/24 1642     (approximate)   History   Chest Pain   HPI  Leslie Barry is a 18 y.o. female with a history of eczema, presents to the ED endorsing left-sided chest pain.  Patient would endorse better than a year of symptoms that have been worked up by her PCP in the past.  Patient reports reproducible left-sided chest wall discomfort aggravated by breathing and movement of her upper extremity.  Patient to this point has declined blood work and chest x-ray for this workup.  She denies any frank cough, congestion, or substernal chest pain.  Physical Exam   Triage Vital Signs: ED Triage Vitals  Encounter Vitals Group     BP 04/25/24 1547 (!) 134/102     Girls Systolic BP Percentile --      Girls Diastolic BP Percentile --      Boys Systolic BP Percentile --      Boys Diastolic BP Percentile --      Pulse Rate 04/25/24 1547 71     Resp 04/25/24 1547 16     Temp 04/25/24 1547 98.2 F (36.8 C)     Temp Source 04/25/24 1547 Oral     SpO2 04/25/24 1547 100 %     Weight --      Height --      Head Circumference --      Peak Flow --      Pain Score 04/25/24 1543 7     Pain Loc --      Pain Education --      Exclude from Growth Chart --     Most recent vital signs: Vitals:   04/25/24 1547  BP: (!) 134/102  Pulse: 71  Resp: 16  Temp: 98.2 F (36.8 C)  SpO2: 100%    General Awake, no distress. NAD HEENT NCAT. PERRL. EOMI. No rhinorrhea. Mucous membranes are moist.  CV:  Good peripheral perfusion. RRR.  No murmur, rub, or gallop noted RESP:  Normal effort.  CTA ABD:  No distention.  Soft and nontender. MSK:  Normal spinal alignment without midline tenderness, spasm conformity, or step-off.  AROM of all extremities.  Patient reproducible tenderness over the left pectoral musculature. SKIN:  No rashes noted.   ED Results /  Procedures / Treatments   Labs (all labs ordered are listed, but only abnormal results are displayed) Labs Reviewed  POC URINE PREG, ED    EKG  Vent. rate 68 BPM PR interval 112 ms QRS duration 80 ms QT/QTcB 374/397 ms P-R-T axes 20 84 74 Normal sinus rhythm Normal ECG When compared with ECG of 25-Sep-2023 15:28, (unconfirmed) No STEMI  RADIOLOGY  No results found.   PROCEDURES:  Critical Care performed: No  Procedures   MEDICATIONS ORDERED IN ED: Medications  lidocaine  (LIDODERM ) 5 % 1 patch (1 patch Transdermal Patch Applied 04/25/24 1724)     IMPRESSION / MDM / ASSESSMENT AND PLAN / ED COURSE  I reviewed the triage vital signs and the nursing notes.                              Differential diagnosis includes, but is not limited to, ACS, aortic dissection, pulmonary embolism, cardiac tamponade, pneumothorax, pneumonia, pericarditis, myocarditis, GI-related causes including esophagitis/gastritis, and musculoskeletal chest wall pain.  Patient's presentation is most consistent with acute, uncomplicated illness.  Patient's diagnosis is consistent with left anterior chest wall pain.  Patient again presents with acute on chronic left anterior chest wall pain.  Patient with limited workup today as she declined blood work and chest x-ray imaging.  No respiratory complaints at this time.  Patient denies any change in her presentation which has been persistent for the last year.  Previous workups have been reassuring.  Patient has had trials of anti-inflammatories and muscle relaxants.  No current treatments reported.  Patient not very forthright with her HPI.  Patient will be discharged home with prescriptions for Lidoderm  patches, tizanidine , and meloxicam . Patient is to follow up with a local urgent care or selected PCP as suggested, as needed or otherwise directed. Patient is given ED precautions to return to the ED for any worsening or new symptoms.  FINAL CLINICAL  IMPRESSION(S) / ED DIAGNOSES   Final diagnoses:  Chest wall pain     Rx / DC Orders   ED Discharge Orders          Ordered    meloxicam  (MOBIC ) 7.5 MG tablet  Daily        04/25/24 1704    lidocaine  (LIDODERM ) 5 %  Every 12 hours PRN        04/25/24 1704    tiZANidine  (ZANAFLEX ) 2 MG tablet  Every 8 hours PRN        04/25/24 1704             Note:  This document was prepared using Dragon voice recognition software and may include unintentional dictation errors.    Loyd Candida LULLA Aldona, PA-C 04/25/24 1914    Levander Slate, MD 04/25/24 [phone]

## 2024-04-25 NOTE — ED Notes (Signed)
 Pt has had labs and xray and refuses this today.

## 2024-04-25 NOTE — Discharge Instructions (Addendum)
 Take the prescription meds as directed. Apply ice packs or moist heat to any sore muscles. Follow-up with your provider or select from the list below.   Please go to the following website to schedule new (and existing) patient appointments:   [email or web address]   The following is a list of primary care offices in the area who are accepting new patients at this time.  Please reach out to one of them directly and let them know you would like to schedule an appointment to follow up on an Emergency Department visit, and/or to establish a new primary care provider (PCP).  There are likely other primary care clinics in the are who are accepting new patients, but this is an excellent place to start:  [facility] Lead physician: Dr Jon Eva [address] [place], [place] [address] [phone]  [facility] Lead Physician: Dr Dorette Loron [address], [place], [place] [address] [phone]  [facility]  Lead Physician: Dr Duwaine Louder [address] [place], [place] [address] [phone]  [facility] Lead Physician: Dr Marolyn Officer [address], [place], [place] [address] [phone]  [facility] at [facility] Lead Physician: Dr Leita Adie [address] [place], [place], [place] [address] [phone]

## 2024-04-28 ENCOUNTER — Ambulatory Visit: Admitting: Physician Assistant

## 2024-05-10 ENCOUNTER — Ambulatory Visit

## 2024-05-10 DIAGNOSIS — L249 Irritant contact dermatitis, unspecified cause: Secondary | ICD-10-CM | POA: Diagnosis not present

## 2024-05-10 DIAGNOSIS — L2084 Intrinsic (allergic) eczema: Secondary | ICD-10-CM

## 2024-05-10 DIAGNOSIS — L209 Atopic dermatitis, unspecified: Secondary | ICD-10-CM

## 2024-05-10 DIAGNOSIS — L299 Pruritus, unspecified: Secondary | ICD-10-CM | POA: Diagnosis not present

## 2024-05-10 DIAGNOSIS — L923 Foreign body granuloma of the skin and subcutaneous tissue: Secondary | ICD-10-CM

## 2024-05-10 MED ORDER — TACROLIMUS 0.1 % EX OINT: TOPICAL_OINTMENT | Freq: Two times a day (BID) | CUTANEOUS | 1 refills | Status: DC

## 2024-05-10 NOTE — Patient Instructions (Addendum)
 Vanicream Antiperspirant Deodorant 2.  VMV Hypoallergenics~  Essence Skin-Saving Antiperspirant  3. Certain Dri~ Clinical Strength Roll-on Antiperspirant  4. ~Cleure~ Spray Deodorant; Roll-on Deodorant; Stick Deodorant  5. Almay roll-on deodorant (roll-on only)  6. Health visitor; Scientist, research (physical sciences)       Due to recent changes in healthcare laws, you may see results of your pathology and/or laboratory studies on MyChart before the doctors have had a chance to review them. We understand that in some cases there may be results that are confusing or concerning to you. Please understand that not all results are received at the same time and often the doctors may need to interpret multiple results in order to provide you with the best plan of care or course of treatment. Therefore, we ask that you please give us  2 business days to thoroughly review all your results before contacting the office for clarification. Should we see a critical lab result, you will be contacted sooner.   If You Need Anything After Your Visit  If you have any questions or concerns for your doctor, please call our main line at [phone] and press option 4 to reach your doctor's medical assistant. If no one answers, please leave a voicemail as directed and we will return your call as soon as possible. Messages left after 4 pm will be answered the following business day.   You may also send us  a message via MyChart. We typically respond to MyChart messages within 1-2 business days.  For prescription refills, please ask your pharmacy to contact our office. Our fax number is [phone].  If you have an urgent issue when the clinic is closed that cannot wait until the next business day, you can page your doctor at the number below.    Please note that while we do our best to be available for urgent issues outside of office hours, we are not available 24/7.   If you have an urgent issue and are  unable to reach us , you may choose to seek medical care at your doctor's office, retail clinic, urgent care center, or emergency room.  If you have a medical emergency, please immediately call 911 or go to the emergency department.  Pager Numbers  - Dr. Hester: [phone]  - Dr. Jackquline: [phone]  - Dr. Claudene: [phone]   - Dr. Raymund: [phone]  In the event of inclement weather, please call our main line at [phone] for an update on the status of any delays or closures.  Dermatology Medication Tips: Please keep the boxes that topical medications come in in order to help keep track of the instructions about where and how to use these. Pharmacies typically print the medication instructions only on the boxes and not directly on the medication tubes.   If your medication is too expensive, please contact our office at [phone] option 4 or send us  a message through MyChart.   We are unable to tell what your co-pay for medications will be in advance as this is different depending on your insurance coverage. However, we may be able to find a substitute medication at lower cost or fill out paperwork to get insurance to cover a needed medication.   If a prior authorization is required to get your medication covered by your insurance company, please allow us  1-2 business days to complete this process.  Drug prices often vary depending on where the prescription is filled and some pharmacies may offer cheaper prices.  The website www.goodrx.com  contains coupons for medications through different pharmacies. The prices here do not account for what the cost may be with help from insurance (it may be cheaper with your insurance), but the website can give you the price if you did not use any insurance.  - You can print the associated coupon and take it with your prescription to the pharmacy.  - You may also stop by our office during regular business hours and pick up a GoodRx coupon  card.  - If you need your prescription sent electronically to a different pharmacy, notify our office through [facility] or by phone at [phone] option 4.     Si Usted Necesita Algo Despus de Su Visita  Tambin puede enviarnos un mensaje a travs de Clinical cytogeneticist. Por lo general respondemos a los mensajes de MyChart en el transcurso de 1 a 2 das hbiles.  Para renovar recetas, por favor pida a su farmacia que se ponga en contacto con nuestra oficina. Randi lakes de fax es [place] [phone].  Si tiene un asunto urgente cuando la clnica est cerrada y que no puede esperar hasta el siguiente da hbil, puede llamar/localizar a su doctor(a) al nmero que aparece a continuacin.   Por favor, tenga en cuenta que aunque hacemos todo lo posible para estar disponibles para asuntos urgentes fuera del horario de [place], no estamos disponibles las 24 horas del da, los 7 [address]  [address] de la [place].   Si tiene un problema urgente y no puede comunicarse con nosotros, puede optar por buscar atencin mdica  en el consultorio de su doctor(a), en una clnica privada, en un centro de atencin urgente o en una sala de emergencias.  Si tiene Engineer, drilling, por favor llame inmediatamente al 911 o vaya a la sala de emergencias.  Nmeros de bper  - Dr. Hester: [phone]  - Dra. Jackquline: [phone]  - Dr. Claudene: [phone]  - Dra. Kitts: [phone]  En caso de inclemencias del [place], por favor llame a nuestra lnea principal al [phone] para una actualizacin sobre el estado de cualquier retraso o cierre.  Consejos para la medicacin en dermatologa: Por favor, guarde las cajas en las que vienen los medicamentos de uso tpico para ayudarle a seguir las instrucciones sobre dnde y cmo usarlos. Las farmacias generalmente imprimen las instrucciones del medicamento slo en las cajas y no directamente en los tubos del [place].   Si su medicamento es muy caro, por favor, pngase  en contacto con landry rieger llamando al [phone] y presione la opcin 4 o envenos un mensaje a travs de Clinical cytogeneticist.   No podemos decirle cul ser su copago por los medicamentos por adelantado ya que esto es diferente dependiendo de la cobertura de su seguro. Sin embargo, es posible que podamos encontrar un medicamento sustituto a Audiological scientist un formulario para que el seguro cubra el medicamento que se considera necesario.   Si se requiere una autorizacin previa para que su compaa de seguros malta su medicamento, por favor permtanos de 1 a 2 das hbiles para completar este proceso.  Los precios de los medicamentos varan con frecuencia dependiendo del Environmental consultant de dnde se surte la receta y alguna farmacias pueden ofrecer precios ms baratos.  El sitio web www.goodrx.com tiene cupones para medicamentos de Health and safety inspector. Los precios aqu no tienen en cuenta lo que podra costar con la ayuda del seguro (puede ser ms barato con su seguro), pero el sitio web puede darle el precio si no utiliz Tourist information centre manager.  -  Puede imprimir el cupn correspondiente y llevarlo con su receta a la farmacia.  - Tambin puede pasar por nuestra oficina durante el horario de atencin regular y Education officer, museum una tarjeta de cupones de GoodRx.  - Si necesita que su receta se enve electrnicamente a una farmacia diferente, informe a nuestra oficina a travs de MyChart de [place] o por telfono llamando al [phone] y presione la opcin 4.

## 2024-05-10 NOTE — Progress Notes (Signed)
   New Patient Visit   Subjective  Leslie Barry is a 18 y.o. female who presents for the following: Itchy rash on her body for ~2 months, using triamcinolone  cream helping a little, patient would like a cream she can use on her neck and axilla.   Also with itching within her tattoo on back - got this several years ago  Also with rash, itching of axilla   The following portions of the chart were reviewed this encounter and updated as appropriate: medications, allergies, medical history  Review of Systems:  No other skin or systemic complaints except as noted in HPI or Assessment and Plan.  Objective  Well appearing patient in no apparent distress; mood and affect are within normal limits.  A focused examination was performed of the following areas: Face,neck,arms,legs,chest,back  Skin colored scaly papules of neck, antecubital and popliteal fossa Tattoo over back without any nodularity Bilateral axilla with hyperpigmented scaly plaques   Relevant exam findings are noted in the Assessment and Plan.     Assessment & Plan     Atopic dermatitis of neck, flexural area - chronic, flaring, not at treatment goal - c/b PIH  - Diagnosis, treatment options, prognosis, risk/ benefit, and side effects of treatment were discussed with the patient.  - Reviewed benign but chronic nature of disease. - Discussed dry skin care at length, recommended avoidance of fragrances, short showers with luke- warm water, no scrubbing, an unscented moisturizing soap (e.g. Dove sensitive skin) limited to the groin and axillae, and frequent emollient use (Eucerin, Aquaphor, Cerave, Vanicream, Vaseline). - Discussed treatment with topical steroids, non steroidal topicals, systemics (dupixent, tralokinumab, nemolizumab, rinvoq)   - Cyclosporine, mycophenolate, azathioprine, methotrexate are older medications but can be considered  - Reviewed proper use of topical steroids to minimize the risk of steroid-induced  skin changes.  - Also discussed appropriate dry skin care including daily warm baths with gentle soap, followed by liberal bland moisturizer application.   Treatment Plan: Start Tacrolimus  ointment apply to affected skin qd - for more mild areas, neck, axilla  Continue Triamcinolone  cream qd-bid prn for more severe areas   Pruritus within tattoo - No nodularity to suggest infection or sarcoid - discussed if develops nodules will need bx, tissue cx  - Discussed red pigment more prone to itch and allergic reactions. Advised can trial tacrolimus  0.1% ointment bid prn   Irritant dermatitis of bilateral axilla - Hypoallergenic deodorant list provided - Tacrolimus  01.% ointment bid prn for flares   Return in about 8 weeks (around 07/05/2024) for Eczema .  IFay Kirks, CMA, am acting as scribe for Lauraine JAYSON Kanaris, MD .   Documentation: I have reviewed the above documentation for accuracy and completeness, and I agree with the above.  Lauraine JAYSON Kanaris, MD

## 2024-05-11 ENCOUNTER — Telehealth: Payer: Self-pay | Admitting: Advanced Practice Midwife

## 2024-05-11 ENCOUNTER — Encounter: Payer: Self-pay | Admitting: Licensed Practical Nurse

## 2024-05-11 ENCOUNTER — Ambulatory Visit

## 2024-05-11 VITALS — BP 101/70 | Ht 60.0 in | Wt 100.9 lb

## 2024-05-11 DIAGNOSIS — Z3042 Encounter for surveillance of injectable contraceptive: Secondary | ICD-10-CM | POA: Diagnosis not present

## 2024-05-11 MED ORDER — MEDROXYPROGESTERONE ACETATE 150 MG/ML IM SUSP
150.0000 mg | Freq: Once | INTRAMUSCULAR | Status: AC
  Administered 2024-05-11: 150 mg via INTRAMUSCULAR

## 2024-05-11 NOTE — Patient Instructions (Signed)

## 2024-05-11 NOTE — Telephone Encounter (Signed)
 Contacted the patient via phone, She is due for annual exam. I left message for the patient to contact the office.

## 2024-05-11 NOTE — Progress Notes (Signed)
    NURSE VISIT NOTE  Subjective:    Patient ID: Leslie Barry, female    DOB: 2005-10-20, 20 y.o.   MRN: [id number]  HPI  Patient is a 18 y.o. G0P0000 female who presents for depo provera  injection.   Objective:    BP 101/70   Ht 5' (1.524 m)   Wt 100 lb 14.4 oz (45.8 kg)   BMI 19.71 kg/m   Last Annual: 05/15/23. Last pap: N/A due to age. Last Depo-Provera : 02/17/24. Side Effects if any: irregular bleeding. Serum HCG indicated? No . Depo-Provera  150 mg IM given by: Camelia Bars, LPN. Site: Left Upper Outer Quandrant   Assessment:   1. Encounter for Depo-Provera  contraception      Plan:   Next appointment due between 11/11 and 08/10/24.    Camelia Bars, LPN

## 2024-05-24 ENCOUNTER — Ambulatory Visit: Admitting: Advanced Practice Midwife

## 2024-06-09 ENCOUNTER — Ambulatory Visit: Admitting: Advanced Practice Midwife

## 2024-06-11 ENCOUNTER — Ambulatory Visit: Payer: Self-pay

## 2024-06-11 NOTE — Telephone Encounter (Signed)
 LVM requesting return call.

## 2024-06-11 NOTE — Telephone Encounter (Signed)
 Patient would give very little information. Patient did endorse chest pain that was at level 10. States chest pain has been going on since April. Patient demanded an appointment. Recommended being seen in UC/ED due to chest pain level. Patient refused stating I don't have time for that. Wanting an appointment. Needing a follow up call.   FYI Only or Action Required?: Action required by provider: request for appointment.  Patient was last seen in primary care on 04/09/2024 by Ziglar, Susan K, MD.  Called Nurse Triage reporting Chest Pain.  Symptoms began several months ago.  Interventions attempted: Nothing.  Symptoms are: unchanged.  Triage Disposition: Go to ED Now (or PCP Triage)  Patient/caregiver understands and will follow disposition?: No, refuses disposition Copied from CRM [id number]. Topic: Clinical - Red Word Triage >> Jun 11, 2024  1:31 PM Rosaria BRAVO wrote: Red Word that prompted transfer to Nurse Triage: Chest pain   ----------------------------------------------------------------------- From previous Reason for Contact - Scheduling: Patient/patient representative is calling to schedule an appointment. Refer to attachments for appointment information. Reason for Disposition  [1] Chest pain lasts > 5 minutes AND [2] occurred in past 3 days (72 hours) (Exception: Feels exactly the same as previously diagnosed heartburn and has accompanying sour taste in mouth.)  Answer Assessment - Initial Assessment Questions 1. LOCATION: Where does it hurt?       Left sided chest pain 2. RADIATION: Does the pain go anywhere else? (e.g., into neck, jaw, arms, back)     no 3. ONSET: When did the chest pain begin? (Minutes, hours or days)      Started back in April 4. PATTERN: Does the pain come and go, or has it been constant since it started?  Does it get worse with exertion?      constant 5. DURATION: How long does it last (e.g., seconds, minutes, hours)     Patient  reports pain is constant since April 6. SEVERITY: How bad is the pain?  (e.g., Scale 1-10; mild, moderate, or severe)     10 7. CARDIAC RISK FACTORS: Do you have any history of heart problems or risk factors for heart disease? (e.g., angina, prior heart attack; diabetes, high blood pressure, high cholesterol, smoker, or strong family history of heart disease)     Patient refused to answer 8. PULMONARY RISK FACTORS: Do you have any history of lung disease?  (e.g., blood clots in lung, asthma, emphysema, birth control pills)     Patient refused to answer 9. CAUSE: What do you think is causing the chest pain?     Refused to answer 10. OTHER SYMPTOMS: Do you have any other symptoms? (e.g., dizziness, nausea, vomiting, sweating, fever, difficulty breathing, cough)       Refused to answer.  Protocols used: Chest Pain-A-AH

## 2024-06-16 ENCOUNTER — Encounter: Payer: Self-pay | Admitting: Family Medicine

## 2024-06-16 ENCOUNTER — Ambulatory Visit (INDEPENDENT_AMBULATORY_CARE_PROVIDER_SITE_OTHER): Admitting: Family Medicine

## 2024-06-16 VITALS — BP 101/65 | HR 88 | Temp 98.6°F | Resp 18 | Ht 60.0 in | Wt 101.0 lb

## 2024-06-16 DIAGNOSIS — L309 Dermatitis, unspecified: Secondary | ICD-10-CM

## 2024-06-16 DIAGNOSIS — J40 Bronchitis, not specified as acute or chronic: Secondary | ICD-10-CM | POA: Diagnosis not present

## 2024-06-16 MED ORDER — PREDNISONE 10 MG (48) PO TBPK: ORAL_TABLET | Freq: Every day | ORAL | 0 refills | Status: DC

## 2024-06-16 MED ORDER — MELOXICAM 7.5 MG PO TABS: 7.5000 mg | ORAL_TABLET | Freq: Every day | ORAL | 0 refills | Status: DC

## 2024-06-16 MED ORDER — TACROLIMUS 0.1 % EX OINT: TOPICAL_OINTMENT | Freq: Two times a day (BID) | CUTANEOUS | 1 refills | Status: DC

## 2024-06-16 NOTE — Assessment & Plan Note (Signed)
 Viral bronchitis.  Does not need ABX.  Prednisone  taper for 12 days 10mg .

## 2024-06-16 NOTE — Progress Notes (Signed)
   Established Patient Office Visit  Subjective   Patient ID: Leslie Barry, female    DOB: [birth year]  Age: 18 y.o. MRN: [id number]  Chief Complaint  Patient presents with   Nasal Congestion    3 wks    Cough    Clear mucus    HPI Discussed the use of AI scribe software for clinical note transcription with the patient, who gave verbal consent to proceed.  History of Present Illness   Leslie Barry is a 19 year old female who presents with a persistent cough for three weeks.  She has been experiencing a persistent cough for three weeks, producing clear sputum and occasionally nasal discharge. Recently, she has developed chest pain associated with the cough. No shortness of breath or wheezing. No fever and maintains a good appetite without weight loss.  She denies a history of asthma from childhood. She smokes marijuana three times a day.    Her eczema has not improved despite seeing a dermatologist and trying a new medication, which was ineffective. She reverted to using her previous medication due to lack of relief. Her neck eczema remains unchanged and she reports it is worse.         Objective:     BP 101/65 (BP Location: Left Arm, Patient Position: Sitting, Cuff Size: Normal)   Pulse 88   Temp 98.6 F (37 C) (Oral)   Resp 18   Ht 5' (1.524 m)   Wt 101 lb (45.8 kg)   SpO2 97%   BMI 19.73 kg/m    Physical Exam Vitals and nursing note reviewed.  Constitutional:      Appearance: Normal appearance.  HENT:     Head: Normocephalic and atraumatic.  Eyes:     Conjunctiva/sclera: Conjunctivae normal.  Cardiovascular:     Rate and Rhythm: Normal rate and regular rhythm.  Pulmonary:     Effort: Pulmonary effort is normal.     Breath sounds: Wheezing present.  Musculoskeletal:     Right lower leg: No edema.     Left lower leg: No edema.  Skin:    General: Skin is warm and dry.  Neurological:     Mental Status: She is alert and oriented to person, place, and time.   Psychiatric:        Mood and Affect: Mood normal.        Behavior: Behavior normal.        Thought Content: Thought content normal.        Judgment: Judgment normal.          No results found for any visits on 06/16/24.    The ASCVD Risk score (Arnett DK, et al., 2019) failed to calculate for the following reasons:   The 2019 ASCVD risk score is only valid for ages 30 to 2    Assessment & Plan:  Bronchitis Assessment & Plan: Viral bronchitis.  Does not need ABX.  Prednisone  taper for 12 days 10mg .    Orders: -     predniSONE ; Take by mouth daily. 12-day taper pack, use as directed for taper  Dispense: 1 each; Refill: 0 -     Meloxicam ; Take 1 tablet (7.5 mg total) by mouth daily.  Dispense: 30 tablet; Refill: 0  Eczema, unspecified type -     Tacrolimus ; Apply topically 2 (two) times daily.  Dispense: 100 g; Refill: 1     Return if symptoms worsen or fail to improve.    Danyle Boening K Mouna Yager, MD

## 2024-06-29 ENCOUNTER — Ambulatory Visit: Admitting: Family Medicine

## 2024-07-01 ENCOUNTER — Emergency Department
Admission: EM | Admit: 2024-07-01 | Discharge: 2024-07-01 | Disposition: A | Discharge status: Home / Self Care | Attending: Emergency Medicine | Admitting: Emergency Medicine

## 2024-07-01 ENCOUNTER — Other Ambulatory Visit: Payer: Self-pay

## 2024-07-01 ENCOUNTER — Emergency Department

## 2024-07-01 ENCOUNTER — Ambulatory Visit (INDEPENDENT_AMBULATORY_CARE_PROVIDER_SITE_OTHER): Admitting: Clinical

## 2024-07-01 DIAGNOSIS — R059 Cough, unspecified: Secondary | ICD-10-CM | POA: Diagnosis not present

## 2024-07-01 DIAGNOSIS — F419 Anxiety disorder, unspecified: Secondary | ICD-10-CM | POA: Diagnosis not present

## 2024-07-01 DIAGNOSIS — R072 Precordial pain: Secondary | ICD-10-CM | POA: Diagnosis present

## 2024-07-01 DIAGNOSIS — R0789 Other chest pain: Secondary | ICD-10-CM | POA: Insufficient documentation

## 2024-07-01 MED ORDER — ALBUTEROL SULFATE (2.5 MG/3ML) 0.083% IN NEBU
2.5000 mg | INHALATION_SOLUTION | Freq: Once | RESPIRATORY_TRACT | Status: AC
  Administered 2024-07-01: 2.5 mg via RESPIRATORY_TRACT
  Filled 2024-07-01: qty 3

## 2024-07-01 MED ORDER — ALBUTEROL SULFATE HFA 108 (90 BASE) MCG/ACT IN AERS: 2.0000 | INHALATION_SPRAY | Freq: Four times a day (QID) | RESPIRATORY_TRACT | 0 refills | Status: AC | PRN

## 2024-07-01 MED ORDER — NICOTINE 7 MG/24HR TD PT24
7.0000 mg | MEDICATED_PATCH | TRANSDERMAL | 0 refills | Status: AC
Start: 2024-07-01 — End: 2024-07-31

## 2024-07-01 MED ORDER — GUAIFENESIN ER 600 MG PO TB12: 600.0000 mg | ORAL_TABLET | Freq: Two times a day (BID) | ORAL | 0 refills | Status: DC

## 2024-07-01 NOTE — ED Provider Notes (Signed)
 [facility] Provider Note    Event Date/Time   First MD Initiated Contact with Patient 07/01/24 1715     (approximate)   History   Cough   HPI  Leslie Barry is a 18 y.o. female  with a past medical history of allergic rhinitis, anxiety, depression, bronchitis presents to the emergency department with left-sided and substernal chest pain, cough and mucous congestion x 1 year.  Patient reports she has been worked up by her primary care provider and seen in the ER for this concern multiple times in the past and has tried muscle relaxers, steroids, mucinex, allergy medication without any relief.  Has not tried any inhaler before.  Patient denies nausea, abdominal pain, shortness of breath, sore throat, otalgia.  No recent illness or travel. Pain is worse with movement of her b/l arms. Denies fall or injury. Pain not worse with exertion. No hx of asthma.   Currently vapes daily.  Former tobacco smoker.  Refuses EKG, respiratory panel, labs in triage and during my initial contact with her. Grandmother present in the room.   Physical Exam   Triage Vital Signs: ED Triage Vitals  Encounter Vitals Group     BP 07/01/24 1611 118/84     Girls Systolic BP Percentile --      Girls Diastolic BP Percentile --      Boys Systolic BP Percentile --      Boys Diastolic BP Percentile --      Pulse Rate 07/01/24 1611 62     Resp 07/01/24 1611 18     Temp 07/01/24 1610 99.5 F (37.5 C)     Temp Source 07/01/24 1610 Oral     SpO2 07/01/24 1611 100 %     Weight --      Height --      Head Circumference --      Peak Flow --      Pain Score --      Pain Loc --      Pain Education --      Exclude from Growth Chart --     Most recent vital signs: Vitals:   07/01/24 1610 07/01/24 1611  BP:  118/84  Pulse:  62  Resp:  18  Temp: 99.5 F (37.5 C)   SpO2:  100%    General: Awake, in no acute distress. Appears stated age. Head: Normocephalic, atraumatic. Eyes:  PERRLA. EOMs intact. No scleral icterus or conjunctival injection. Ears/Nose/Throat: TMs intact b/l. Nares patent, no nasal discharge, no rhinorrhea. Oropharynx moist, no erythema or exudate. Dentition intact. Neck: Supple, no lymphadenopathy, no nuchal rigidity. CV: Good peripheral perfusion. Regular rate. No murmur, rub or gallop. No edema.  Respiratory:Normal respiratory effort.  No respiratory distress. CTAB. GI: Soft, non-distended, non-tender.  MSK: Moving all extremities with ease and 5/5 strength in b/l upper extremities. No midline cervical tenderness. Reproducible tenderness over the mid sternal region and left pectoralis. Skin:Warm, dry, intact. No rashes. Neurological: A&Ox4 to person, place, time, and situation.   ED Results / Procedures / Treatments   Labs (all labs ordered are listed, but only abnormal results are displayed) Labs Reviewed - No data to display   EKG  Refused.   RADIOLOGY CXR ordered.  FINDINGS: The heart size and mediastinal contours are within normal limits. Both lungs are clear. The visualized skeletal structures are unremarkable.   IMPRESSION: No active cardiopulmonary disease.   PROCEDURES:  Critical Care performed: No   Procedures   MEDICATIONS  ORDERED IN ED: Medications  albuterol (PROVENTIL) (2.5 MG/3ML) 0.083% nebulizer solution 2.5 mg (2.5 mg Nebulization Given 07/01/24 1801)     IMPRESSION / MDM / ASSESSMENT AND PLAN / ED COURSE  I reviewed the triage vital signs and the nursing notes.                              Differential diagnosis includes, but is not limited to, ACS, viral illness, pneumothorax, pneumonia, pericarditis, myocarditis, GERD, MSK chest wall pain/costochondritis, bronchitis, vape/smoking-related chest pain  Patient's presentation is most consistent with exacerbation of chronic illness.  Patient here with chronic left and sternal anterior chest wall pain that has been present for over a year.  No new  changes. Patient denied any blood work or EKG today.  Only agreed to chest x-ray imaging.  Discussed the importance of a full workup with her, but she still declines.  Chest x-ray was ordered.  I independently viewed the x-ray and radiologist's report.  I agree with the radiologist's report that there are no acute findings.  Did do 1 round of albuterol here.  All her vital signs are within normal range.  She is well-appearing and not having any stridor or wheezing, rales, rhonchi on exam.  Patient has had many trials of anti-inflammatories including meloxicam , muscle relaxants, Mucinex, allergy medication.   Believe this is likely musculoskeletal based versus related to her vaping.   Did discharge her and discussed continuing use of Mucinex, Claritin , meloxicam  at home. Did also provide her with the albuterol inhaler if she starts wheezing.  Discussed smoking cessation and nicotine patches.  Patient is to follow-up with her primary care provider.  Also provided as-needed referral to pulmonology if symptoms persist despite treatment.  The patient may return to the emergency department for any new, worsening, or concerning symptoms. Patient was given the opportunity to ask questions; all questions were answered. Emergency department return precautions were discussed with the patient.  Patient is in agreement to the treatment plan.  Patient is stable for discharge.    FINAL CLINICAL IMPRESSION(S) / ED DIAGNOSES   Final diagnoses:  Anterior chest wall pain     Rx / DC Orders   ED Discharge Orders          Ordered    albuterol (VENTOLIN HFA) 108 (90 Base) MCG/ACT inhaler  Every 6 hours PRN        07/01/24 1826    nicotine (NICODERM CQ - DOSED IN MG/24 HR) 7 mg/24hr patch  Every 24 hours        07/01/24 1826    guaiFENesin (MUCINEX) 600 MG 12 hr tablet  2 times daily        07/01/24 1826             Note:  This document was prepared using Dragon voice recognition software and may include  unintentional dictation errors.     Sheron Salm, PA-C 07/01/24 2009    Waymond Lorelle Cummins, MD 07/02/24 1224

## 2024-07-01 NOTE — ED Notes (Signed)
 See triage note  Presents with cough and chest discomfort.States she has had sx's for about 1 year  Low grade temp on arrival

## 2024-07-01 NOTE — Progress Notes (Signed)
 [facility] Counselor Initial Adult Exam  Name: Leslie Barry Date: 07/01/2024 MRN: [id number] DOB: Jan 22, 2006 PCP: Ziglar, Susan K, MD  Time spent: 2:34pm - 2:56pm   Guardian/Payee:  NA    Paperwork requested: NA  Reason for Visit /Presenting Problem: Patient stated, I got a referral from my doctor (PCP).   Mental Status Exam: Appearance:   Neat and Well Groomed     Behavior:  Evasive and vaping and focused on Ipad during session  Motor:  Normal  Speech/Language:   Clear and Coherent and Normal Rate  Affect:  Flat  Mood:  irritable  Thought process:  normal  Thought content:    WNL  Sensory/Perceptual disturbances:    WNL  Orientation:  oriented to person, place, situation, day of week, month of year, and stated date of October 16th  Attention:  Fair  Concentration:  Fair  Memory:  WNL  Fund of knowledge:   Good  Insight:    Fair  Judgment:   Good  Impulse Control:  Good   Reported Symptoms:  Patient reported anxiety, decreased sleep, decreased concentration/focus, sweating, irritability, everybody irritates me, I'm irritated right now, I'm always ready to yell, restlessness. Patient reported experiencing anxiety since turning 16 and experiencing a failed relationship.   Risk Assessment: Danger to Self:  No Patient denied current and past suicidal ideation, homicidal ideation and symptoms of psychosis Self-injurious Behavior: No Danger to Others: No Duty to Warn:no Physical Aggression / Violence:No  Access to Firearms a concern: No  Gang Involvement:No  Patient / guardian was educated about steps to take if suicide or homicide risk level increases between visits: yes While future psychiatric events cannot be accurately predicted, the patient does not currently require acute inpatient psychiatric care and does not currently meet [facility]  involuntary commitment criteria.  Substance Abuse History: Current substance abuse: No   Patient  reported vaping once in a while and was vaping during session. Patient reported no current or past tobacco use. Patient reported no current use of alcohol . Patient reported a history of alcohol  use and stated, I drink when I go out. Patient reported no current drug use. Patient reported a history of using cannabis and stopped in June 2023.  Past Psychiatric History:   No previous psychological problems have been observed Outpatient Providers: none History of Psych Hospitalization: No  Psychological Testing: none   Abuse History:  Victim of: No., none   Report needed: No. Victim of Neglect:No. Perpetrator of none reported  Witness / Exposure to Domestic Violence: No   Protective Services Involvement: No  Witness to [employer] Violence:  No   Family History:  Family History  Problem Relation Age of Onset   Breast cancer Mother 3   Breast cancer Maternal Grandmother 41    Living situation: the patient lives with their family (lives with mother)  Sexual Orientation: Straight  Relationship Status: single  Name of spouse / other: NA If a parent, number of children / ages: 0  Support Systems: grandmother  Surveyor, quantity Stress:  No   Income/Employment/Disability: Employment full time and currently enrolled in [employer]: No   Educational History: Education: some college  Religion/Sprituality/World View: christian  Any cultural differences that may affect / interfere with treatment:  not applicable   Recreation/Hobbies: Patient stated, I don't enjoy doing anything  Stressors: Other: Patient stated just sick of people, asking me questions,  people wanting something from me    Strengths: Patient stated, I just vape  and that's it.   Barriers:  none   Legal History: Pending legal issue / charges: The patient has no significant history of legal issues. History of legal issue / charges: none  Medical History/Surgical History: reviewed Past  Medical History:  Diagnosis Date   Eczema     Past Surgical History:  Procedure Laterality Date   WISDOM TOOTH EXTRACTION      Medications: Current Outpatient Medications  Medication Sig Dispense Refill   loratadine  (CLARITIN ) 10 MG tablet Take 1 tablet (10 mg total) by mouth daily. 90 tablet 3   medroxyPROGESTERone  (DEPO-PROVERA ) 150 MG/ML injection Inject 150 mg into the muscle every 3 (three) months.     meloxicam  (MOBIC ) 7.5 MG tablet Take 1 tablet (7.5 mg total) by mouth daily. 30 tablet 0   predniSONE  (STERAPRED UNI-PAK 48 TAB) 10 MG (48) TBPK tablet Take by mouth daily. 12-day taper pack, use as directed for taper 1 each 0   tacrolimus  (PROTOPIC ) 0.1 % ointment Apply topically 2 (two) times daily. 100 g 1   triamcinolone  cream (KENALOG ) 0.1 % Apply 1 Application topically 2 (two) times daily. 30 g 0   No current facility-administered medications for this visit.    No Known Allergies  Diagnoses:  Anxiety disorder, unspecified type  Plan of Care: Patient is a 18 year old female who presented for an initial assessment. Clinician conducted initial assessment via caregility video from clinician's home office. Patient provided verbal consent to proceed with telehealth session and is aware of limitations of telephone or video visits. Patient participated in session from patient's home. Patient reported the following symptoms: anxiety, decreased sleep, decreased concentration/focus, sweating, irritability, everybody irritates me, I'm irritated right now, I'm always ready to yell, restlessness. Patient denied current and past suicidal ideation, homicidal ideation and symptoms of psychosis. Patient reported vaping once in a while. and was vaping during session. Patient reported no current or past tobacco use. Patient reported no current use of alcohol . Patient reported a history of alcohol  use and stated, I drink when I go out. Patient reported no current drug use. Patient reported a  history of using cannabis and stopped in June 2023. Patient reported no history of outpatient or inpatient behavioral health treatment. Patient reported being around others is a stressor. Patient reported patient's grandmother is a current support. It is recommended patient participate in individual therapy biweekly. Clinician will review recommendations and treatment plan with patient during follow up appointment. Treatment plan will be developed during follow up appointment.   Collaboration of Care: Other Patient declined to complete consents at this time  Patient/Guardian was advised Release of Information must be obtained prior to any record release in order to collaborate their care with an outside provider.   Consent: Patient/Guardian gives verbal consent for treatment and assignment of benefits for services provided during this visit. Patient/Guardian expressed understanding and agreed to proceed.   Darice Seats, LCSW

## 2024-07-01 NOTE — Progress Notes (Signed)
   Darice Seats, LCSW

## 2024-07-01 NOTE — ED Triage Notes (Signed)
 Patient states I've got bronchitis and had chest pain for a year. I've been seen here before and nobody helps me. I will only do an xray today.

## 2024-07-01 NOTE — Discharge Instructions (Addendum)
 You were seen in the emergency department for chest wall pain.  Attempt to use the nicotine patches.  You can place 1 on your arm daily and take it off when replacing a new one. This will help you stop vaping.  You may use albuterol inhaler as needed.  Continue your meloxicam  and Claritin  prescriptions at home.  You may also use Mucinex if you continue having congestion.  Follow-up with your primary care provider following today's visit.  I will also place a referral to talk to a pulmonologist should these concerns continue.

## 2024-07-05 ENCOUNTER — Ambulatory Visit

## 2024-07-05 DIAGNOSIS — L209 Atopic dermatitis, unspecified: Secondary | ICD-10-CM | POA: Diagnosis not present

## 2024-07-05 DIAGNOSIS — L2084 Intrinsic (allergic) eczema: Secondary | ICD-10-CM

## 2024-07-05 DIAGNOSIS — L74519 Primary focal hyperhidrosis, unspecified: Secondary | ICD-10-CM

## 2024-07-05 DIAGNOSIS — R61 Generalized hyperhidrosis: Secondary | ICD-10-CM

## 2024-07-05 MED ORDER — TRIAMCINOLONE ACETONIDE 0.1 % EX CREA: TOPICAL_CREAM | CUTANEOUS | 5 refills | Status: AC

## 2024-07-05 MED ORDER — OPZELURA 1.5 % EX CREA: TOPICAL_CREAM | CUTANEOUS | 0 refills | Status: DC

## 2024-07-05 MED ORDER — DRYSOL 20 % EX SOLN: Freq: Every day | CUTANEOUS | 0 refills | Status: DC

## 2024-07-05 NOTE — Progress Notes (Signed)
    Subjective   Leslie Barry is a 18 y.o. female who presents for the following: Follow up of atopic dermatitis. Patient is established patient   Today patient reports: Patient reports improvement of symtoms. She has stopped using Tacrolimus  but is using Triamcinolone .  Review of Systems:    No other skin or systemic complaints except as noted in HPI or Assessment and Plan.  The following portions of the chart were reviewed this encounter and updated as appropriate: medications, allergies, medical history  Relevant Medical History:  n/a   Objective  Well appearing patient in no apparent distress; mood and affect are within normal limits. Examination was performed of the: Sun Exposed Exam: Scalp, head, eyes, ears, nose, lips, neck, upper extremities, hands, fingers, fingernails  Examination notable for: Atopic dermatitis: Diffuse xerosis with erythematous plaques on the on the trunk and extremities, most prominently in the flexural areas with moderate lichenification, erythema, and pigment alteration focally  Examination limited by: Clothing and Patient deferred removal       Assessment & Plan   Atopic dermatitis of neck, flexural area - Chronic and persistent condition with duration or expected duration over one year. Condition is improving with treatment but not currently at goal. - c/b PIH  - Diagnosis, treatment options, prognosis, risk/ benefit, and side effects of treatment were discussed with the patient.  - Reviewed benign but chronic nature of disease. - Discussed dry skin care at length, recommended avoidance of fragrances, short showers with luke- warm water, no scrubbing, an unscented moisturizing soap (e.g. Dove sensitive skin) limited to the groin and axillae, and frequent emollient use (Eucerin, Aquaphor, Cerave, Vanicream, Vaseline). - Discussed treatment with topical steroids, non steroidal topicals, systemics (dupixent, tralokinumab, nemolizumab, rinvoq)               - Cyclosporine, mycophenolate, azathioprine, methotrexate are older medications but can be considered  - Reviewed proper use of topical steroids to minimize the risk of steroid-induced skin changes.  - Also discussed appropriate dry skin care including daily warm baths with gentle soap, followed by liberal bland moisturizer application.    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 Discussed when there is generalized disease, only oral Rx will help. - Discussed common side effects of anticholinergic medications, including xerosis, xerostomia, xerophthalmia, urinary retention, constipation - start drysol 20%    Procedures, orders, diagnosis for this visit:    There are no diagnoses linked to this encounter.  Return to clinic: Return in about 6 months (around 01/03/2025) for Atopic Derm.  I, Emerick Ege, CMA am acting as scribe for Lauraine JAYSON Kanaris, MD.   Documentation: I have reviewed the above documentation for accuracy and completeness, and I agree with the above.  Lauraine JAYSON Kanaris, MD

## 2024-07-05 NOTE — Patient Instructions (Addendum)
 Your prescription was sent to [facility] in [place]. A representative from [facility] will contact you within 3 business hours to verify your address and insurance information to schedule a free delivery. If for any reason you do not receive a phone call from them, please reach out to them. Their phone number is [phone] and their hours are Monday-Friday 9:00 am-5:00 pm.     Moisturizer: Apply a moisturizer throughout the day and after bathing.  When you moisturize after bathing, this locks in the moisture.  This can lead to softer and smoother skin.  Body moisturizers come in ointments, creams, and lotions.  If you have dry skin, we recommend the use of ointments or creams rather than lotions.  In other words, something you scoop out of a jar rather than squirted out.  Ointments and creams are thicker and thus provide better moisturization.      Moisturizers Apply a moisturizer to your skin at least once a day (even if you do not bathe).   Cool moisturizers on your skin help with itching (to accomplish this, place your moisturizers and medicated creams in the refrigerator). In general, people with dry skin need a moisturizer that is scooped and not squirted.  - Ointments (petrolatum ointment): greasy, but are the best moisturizers Vaseline, Aquaphor  - Creams (thick, white cream that comes in a jar and is scooped with your hand) Cerave, Cetaphil, Eucerin, Vanicream  - Lotions (comes in a pump) is the weakest moisturizer but is an acceptable choice for the face if you have an oily face Cetaphil, Cerave, Curel, Neutrogena, Lubriderm, Aveeno    Due to recent changes in healthcare laws, you may see results of your pathology and/or laboratory studies on MyChart before the doctors have had a chance to review them. We understand that in some cases there may be results that are confusing or concerning to you. Please understand that not all results are received at the same time and  often the doctors may need to interpret multiple results in order to provide you with the best plan of care or course of treatment. Therefore, we ask that you please give us  2 business days to thoroughly review all your results before contacting the office for clarification. Should we see a critical lab result, you will be contacted sooner.   If You Need Anything After Your Visit  If you have any questions or concerns for your doctor, please call our main line at [phone] and press option 4 to reach your doctor's medical assistant. If no one answers, please leave a voicemail as directed and we will return your call as soon as possible. Messages left after 4 pm will be answered the following business day.   You may also send us  a message via MyChart. We typically respond to MyChart messages within 1-2 business days.  For prescription refills, please ask your pharmacy to contact our office. Our fax number is [phone].  If you have an urgent issue when the clinic is closed that cannot wait until the next business day, you can page your doctor at the number below.    Please note that while we do our best to be available for urgent issues outside of office hours, we are not available 24/7.   If you have an urgent issue and are unable to reach us , you may choose to seek medical care at your doctor's office, retail clinic, urgent care center, or emergency room.  If you have a medical emergency, please  immediately call 911 or go to the emergency department.  Pager Numbers  - Dr. Hester: [phone]  - Dr. Jackquline: [phone]  - Dr. Claudene: [phone]   - Dr. Raymund: [phone]  In the event of inclement weather, please call our main line at [phone] for an update on the status of any delays or closures.  Dermatology Medication Tips: Please keep the boxes that topical medications come in in order to help keep track of the instructions about where and how to use these.  Pharmacies typically print the medication instructions only on the boxes and not directly on the medication tubes.   If your medication is too expensive, please contact our office at [phone] option 4 or send us  a message through MyChart.   We are unable to tell what your co-pay for medications will be in advance as this is different depending on your insurance coverage. However, we may be able to find a substitute medication at lower cost or fill out paperwork to get insurance to cover a needed medication.   If a prior authorization is required to get your medication covered by your insurance company, please allow us  1-2 business days to complete this process.  Drug prices often vary depending on where the prescription is filled and some pharmacies may offer cheaper prices.  The website www.goodrx.com contains coupons for medications through different pharmacies. The prices here do not account for what the cost may be with help from insurance (it may be cheaper with your insurance), but the website can give you the price if you did not use any insurance.  - You can print the associated coupon and take it with your prescription to the pharmacy.  - You may also stop by our office during regular business hours and pick up a GoodRx coupon card.  - If you need your prescription sent electronically to a different pharmacy, notify our office through [facility] or by phone at [phone] option 4.     Si Usted Necesita Algo Despus de Su Visita  Tambin puede enviarnos un mensaje a travs de Clinical cytogeneticist. Por lo general respondemos a los mensajes de MyChart en el transcurso de 1 a 2 das hbiles.  Para renovar recetas, por favor pida a su farmacia que se ponga en contacto con nuestra oficina. Randi lakes de fax es [place] [phone].  Si tiene un asunto urgente cuando la clnica est cerrada y que no puede esperar hasta el siguiente da hbil, puede llamar/localizar a su doctor(a) al nmero  que aparece a continuacin.   Por favor, tenga en cuenta que aunque hacemos todo lo posible para estar disponibles para asuntos urgentes fuera del horario de [place], no estamos disponibles las 24 horas del da, los 7 [address]  [address] de la [place].   Si tiene un problema urgente y no puede comunicarse con nosotros, puede optar por buscar atencin mdica  en el consultorio de su doctor(a), en una clnica privada, en un centro de atencin urgente o en una sala de emergencias.  Si tiene Engineer, drilling, por favor llame inmediatamente al 911 o vaya a la sala de emergencias.  Nmeros de bper  - Dr. Hester: [phone]  - Dra. Jackquline: [phone]  - Dr. Claudene: [phone]  - Dra. Kitts: [phone]  En caso de inclemencias del [place], por favor llame a nuestra lnea principal al [phone] para una actualizacin sobre el estado de cualquier retraso o cierre.  Consejos para la medicacin en dermatologa: Por favor, guarde las [employer]  vienen los medicamentos de uso tpico para ayudarle a seguir las instrucciones sobre dnde y cmo usarlos. Las farmacias generalmente imprimen las instrucciones del medicamento slo en las cajas y no directamente en los tubos del [place].   Si su medicamento es muy caro, por favor, pngase en contacto con landry rieger llamando al [phone] y presione la opcin 4 o envenos un mensaje a travs de Clinical cytogeneticist.   No podemos decirle cul ser su copago por los medicamentos por adelantado ya que esto es diferente dependiendo de la cobertura de su seguro. Sin embargo, es posible que podamos encontrar un medicamento sustituto a Audiological scientist un formulario para que el seguro cubra el medicamento que se considera necesario.   Si se requiere una autorizacin previa para que su compaa de seguros malta su medicamento, por favor permtanos de 1 a 2 das hbiles para completar este proceso.  Los precios de los medicamentos varan con frecuencia  dependiendo del Environmental consultant de dnde se surte la receta y alguna farmacias pueden ofrecer precios ms baratos.  El sitio web www.goodrx.com tiene cupones para medicamentos de Health and safety inspector. Los precios aqu no tienen en cuenta lo que podra costar con la ayuda del seguro (puede ser ms barato con su seguro), pero el sitio web puede darle el precio si no utiliz Tourist information centre manager.  - Puede imprimir el cupn correspondiente y llevarlo con su receta a la farmacia.  - Tambin puede pasar por nuestra oficina durante el horario de atencin regular y Education officer, museum una tarjeta de cupones de GoodRx.  - Si necesita que su receta se enve electrnicamente a una farmacia diferente, informe a nuestra oficina a travs de MyChart de [place] o por telfono llamando al [phone] y presione la opcin 4.

## 2024-07-06 ENCOUNTER — Ambulatory Visit: Admitting: Family Medicine

## 2024-07-06 ENCOUNTER — Telehealth: Payer: Self-pay | Admitting: Family Medicine

## 2024-07-06 NOTE — Telephone Encounter (Signed)
 Called pt and offered to r/s missed appt from this afternoon 07/06/2024. Pt got really hateful with me. Pt stated in a hateful tone and manor that she can schedule it online herself and hung up the phone on me. Pt has missed 4 appts including this one with our facility.

## 2024-07-13 ENCOUNTER — Ambulatory Visit: Admitting: Family Medicine

## 2024-07-14 ENCOUNTER — Ambulatory Visit (INDEPENDENT_AMBULATORY_CARE_PROVIDER_SITE_OTHER): Admitting: Family Medicine

## 2024-07-14 ENCOUNTER — Encounter: Payer: Self-pay | Admitting: Family Medicine

## 2024-07-14 VITALS — BP 113/77 | HR 98 | Temp 98.4°F | Resp 20 | Ht 60.0 in | Wt 99.0 lb

## 2024-07-14 DIAGNOSIS — R079 Chest pain, unspecified: Secondary | ICD-10-CM

## 2024-07-14 DIAGNOSIS — J0110 Acute frontal sinusitis, unspecified: Secondary | ICD-10-CM

## 2024-07-14 DIAGNOSIS — R0981 Nasal congestion: Secondary | ICD-10-CM | POA: Diagnosis not present

## 2024-07-14 MED ORDER — GUAIFENESIN ER 600 MG PO TB12: 600.0000 mg | ORAL_TABLET | Freq: Two times a day (BID) | ORAL | 0 refills | Status: AC

## 2024-07-14 MED ORDER — MELOXICAM 7.5 MG PO TABS: 7.5000 mg | ORAL_TABLET | Freq: Two times a day (BID) | ORAL | 0 refills | Status: AC

## 2024-07-14 MED ORDER — DOXYCYCLINE HYCLATE 100 MG PO CAPS: 100.0000 mg | ORAL_CAPSULE | Freq: Two times a day (BID) | ORAL | 0 refills | Status: DC

## 2024-07-15 ENCOUNTER — Other Ambulatory Visit: Payer: Self-pay | Admitting: Family Medicine

## 2024-07-15 DIAGNOSIS — R079 Chest pain, unspecified: Secondary | ICD-10-CM

## 2024-07-15 MED ORDER — TIZANIDINE HCL 2 MG PO TABS: 2.0000 mg | ORAL_TABLET | Freq: Three times a day (TID) | ORAL | 1 refills | Status: DC

## 2024-07-15 NOTE — Telephone Encounter (Unsigned)
 Copied from CRM #[id number]. Topic: Clinical - Medication Refill >> Jul 15, 2024 12:10 PM Mia F wrote: Medication: tiZANidine  (ZANAFLEX ) 2 MG tablet  -Pt was in office yesterday and was told rx was going to be sent. She says what she had left pcp threw away and now she need a new rx  Has the patient contacted their pharmacy? No (Agent: If no, request that the patient contact the pharmacy for the refill. If patient does not wish to contact the pharmacy document the reason why and proceed with request.) (Agent: If yes, when and what did the pharmacy advise?)  This is the patient's preferred pharmacy:  [facility] [address]), [place] - [address] EU[place]E OTHEL JACOBS [place]) [place] [address] Phone: [phone] Fax: [phone]   Is this the correct pharmacy for this prescription? Yes If no, delete pharmacy and type the correct one.   Has the prescription been filled recently? Yes  Is the patient out of the medication? Yes  Has the patient been seen for an appointment in the last year OR does the patient have an upcoming appointment? Yes  Can we respond through MyChart? Yes  Agent: Please be advised that Rx refills may take up to 3 business days. We ask that you follow-up with your pharmacy.

## 2024-07-15 NOTE — Assessment & Plan Note (Signed)
 Will give her a trial of doxycycline  as she is coughing up mucus.  Also meloxicam  7.5 mg 2 times a day for the pain and guaifenesin 600 mg twice daily.  Follow-up in about 10 days.

## 2024-07-15 NOTE — Progress Notes (Signed)
 Established Patient Office Visit  Subjective   Patient ID: Leslie Barry, female    DOB: 10-Feb-2006  Age: 18 y.o. MRN: [id number]  Chief Complaint  Patient presents with   URI   Abdominal Pain    LUQ  2 wks.     URI  Associated symptoms include abdominal pain.  Abdominal Pain   Discussed the use of AI scribe software for clinical note transcription with the patient, who gave verbal consent to proceed.  History of Present Illness   Leslie Barry is a 18 year old female who presents with persistent cough and chest pain.  She has been experiencing a persistent cough for the past three weeks, producing brown sputum. The cough is constant and worsens with deep breaths. She uses an albuterol inhaler in the mornings and when feeling 'gassy' or like there is air in her chest, which provides minimal relief. Mucinex is also taken but does not significantly alleviate her symptoms.  Chest pain has been present for two weeks, initially in the chest but now primarily under the left breast, sometimes affecting the nipple area. She denies that her breast hurts.  She uses lidocaine  patches and heat patches for pain relief. The pain is exacerbated by coughing and deep breathing. She recalls back soreness after an incident. She takes meloxicam  7.5 mg at night for pain relief, sometimes in the morning, and tizanidine  as a muscle relaxer. Tizanidine  allows her to be more active, as she usually has to lay down after work or school due to discomfort. A course of prednisone  was completed following a tapering schedule, but it was not helpful for her symptoms.  She has a history of smoking but quit on July 05, 2024, with the help of nicotine patches. She experiences frequent headaches, attributed to environmental factors such as noise from her family. Allergy medication is taken, but it no longer seems effective. Her nose runs at night, and she continues to blow her nose frequently despite taking Mucinex.      She was seen in the ED 07/01/2024 for this chest pain.  She refused an EKG and respiratory panel.  She did have a chest x-ray that showed no active cardiopulmonary disease.  ER doctor gave her an albuterol inhaler which she has been using.      Objective:     BP 113/77 (BP Location: Left Arm, Patient Position: Sitting, Cuff Size: Normal)   Pulse 98   Temp 98.4 F (36.9 C) (Oral)   Resp 20   Ht 5' (1.524 m)   Wt 99 lb (44.9 kg)   SpO2 97%   BMI 19.33 kg/m    Physical Exam Vitals and nursing note reviewed.  Constitutional:      Appearance: Normal appearance.  HENT:     Head: Normocephalic and atraumatic.  Eyes:     Conjunctiva/sclera: Conjunctivae normal.  Cardiovascular:     Rate and Rhythm: Normal rate and regular rhythm.  Pulmonary:     Effort: Pulmonary effort is normal.     Breath sounds: Normal breath sounds.  Musculoskeletal:     Right lower leg: No edema.     Left lower leg: No edema.  Skin:    General: Skin is warm and dry.  Neurological:     Mental Status: She is alert and oriented to person, place, and time.  Psychiatric:        Mood and Affect: Mood normal.        Behavior: Behavior normal.  Thought Content: Thought content normal.        Judgment: Judgment normal.          No results found for any visits on 07/14/24.    The ASCVD Risk score (Arnett DK, et al., 2019) failed to calculate for the following reasons:   The 2019 ASCVD risk score is only valid for ages 75 to 4    Assessment & Plan:  Nasal congestion -     guaiFENesin ER; Take 1 tablet (600 mg total) by mouth 2 (two) times daily.  Dispense: 60 tablet; Refill: 0  Chest pain, unspecified type Assessment & Plan: Will give her a trial of doxycycline  as she is coughing up mucus.  Also meloxicam  7.5 mg 2 times a day for the pain and guaifenesin 600 mg twice daily.  Follow-up in about 10 days.  Orders: -     Meloxicam ; Take 1 tablet (7.5 mg total) by mouth 2 (two) times daily.   Dispense: 60 tablet; Refill: 0  Acute non-recurrent frontal sinusitis -     Doxycycline  Hyclate; Take 1 capsule (100 mg total) by mouth 2 (two) times daily.  Dispense: 20 capsule; Refill: 0     Return in about 10 days (around 07/24/2024).    Leslie Friscia K Wayland Baik, MD

## 2024-07-25 ENCOUNTER — Telehealth: Payer: Self-pay | Admitting: Family Medicine

## 2024-07-25 DIAGNOSIS — J40 Bronchitis, not specified as acute or chronic: Secondary | ICD-10-CM

## 2024-07-25 HISTORY — DX: Bronchitis, not specified as acute or chronic: J40

## 2024-07-25 NOTE — Telephone Encounter (Signed)
 Called and LM that I am trying to reach her.

## 2024-07-26 ENCOUNTER — Telehealth: Payer: Self-pay | Admitting: Family Medicine

## 2024-07-26 ENCOUNTER — Other Ambulatory Visit: Payer: Self-pay

## 2024-07-26 ENCOUNTER — Other Ambulatory Visit: Payer: Self-pay | Admitting: Family Medicine

## 2024-07-26 DIAGNOSIS — G43009 Migraine without aura, not intractable, without status migrainosus: Secondary | ICD-10-CM

## 2024-07-26 DIAGNOSIS — J40 Bronchitis, not specified as acute or chronic: Secondary | ICD-10-CM

## 2024-07-26 MED ORDER — RIZATRIPTAN BENZOATE 10 MG PO TBDP: 10.0000 mg | ORAL_TABLET | ORAL | 3 refills | Status: DC | PRN

## 2024-07-26 MED ORDER — AZITHROMYCIN 250 MG PO TABS: ORAL_TABLET | ORAL | 0 refills | Status: AC

## 2024-07-26 NOTE — Telephone Encounter (Signed)
 She is having terrible headaches and taking Tylenol.  1 day she took 9 Tylenol in 24 hours.  Advised not to do that.  Hold up to 4 a day.  Offered her Imitrex to see if that would affect her migraines.  She is very thin and concerned that Topamax would cause weight loss. She still has a cough productive of sputum but she does not smoke.  She finished the doxycycline .  Will try azithromycin and see how she does.

## 2024-07-27 ENCOUNTER — Ambulatory Visit: Payer: Self-pay

## 2024-07-27 ENCOUNTER — Telehealth: Payer: Self-pay

## 2024-07-27 ENCOUNTER — Telehealth: Payer: Self-pay | Admitting: Family Medicine

## 2024-07-27 ENCOUNTER — Ambulatory Visit: Admitting: Family Medicine

## 2024-07-27 ENCOUNTER — Ambulatory Visit: Payer: Self-pay | Admitting: *Deleted

## 2024-07-27 NOTE — Telephone Encounter (Signed)
 Copied from CRM [id number]. Topic: Clinical - Red Word Triage >> Jul 27, 2024  2:03 PM Lonell PEDLAR wrote: Red Word that prompted transfer to Nurse Triage: Patient with worsening headache Reason for Disposition  [1] SEVERE headache (e.g., excruciating) AND [2] not improved after 2 hours of pain medicine  Answer Assessment - Initial Assessment Questions Patient is upset- she sent messages Friday and did not hear from provider. Patient has taken one dose of migraine medication and still has headache- advised for follow up dose- if that does not help- UC advised . Did offer to schedule at office but patient states she has moved and [place] is a long way for her to travel.   1. LOCATION: Where does it hurt?      Forehead behind the eyes- middle of forehead 2. ONSET: When did the headache start? (e.g., minutes, hours, days)      This headache all day 3. PATTERN: Does the pain come and go, or has it been constant since it started?     All day  4. SEVERITY: How bad is the pain? and What does it keep you from doing?  (e.g., Scale 1-10; mild, moderate, or severe)     8/10 5. RECURRENT SYMPTOM: Have you ever had headaches before? If Yes, ask: When was the last time? and What happened that time?      Yes- nothing- patient is upset she did not hear from the doctor 6. CAUSE: What do you think is causing the headache?     Cough still present 7. MIGRAINE: Have you been diagnosed with migraine headaches? If Yes, ask: Is this headache similar?      no 8. HEAD INJURY: Has there been any recent injury to your head?       no 9. OTHER SYMPTOMS: Do you have any other symptoms? (e.g., fever, stiff neck, eye pain, sore throat, cold symptoms)     no  Protocols used: Headache-A-AH

## 2024-07-27 NOTE — Telephone Encounter (Signed)
 Copied from CRM [id number]. Topic: Clinical - Medication Question >> Jul 27, 2024  4:34 PM Dedra B wrote: Reason for CRM: Pt said she took 2 rizatriptan so far and wants to know if she can take another one.

## 2024-07-27 NOTE — Telephone Encounter (Signed)
 FYI Only or Action Required?: FYI only for provider: Patient declines appointment- UC advised if her symptoms continue or get worse.  Patient was last seen in primary care on 07/14/2024 by Ziglar, Susan K, MD.  Called Nurse Triage reporting Headache.  Symptoms began today.  Interventions attempted: OTC medications: tylenol.  Symptoms are: unchanged.  Triage Disposition: See HCP Within 4 Hours (Or PCP Triage)  Patient/caregiver understands and will follow disposition?: No, refuses disposition

## 2024-07-27 NOTE — Telephone Encounter (Unsigned)
 Copied from CRM #[id number]. Topic: General - Other >> Jul 27, 2024  1:56 PM Tiffini S wrote: Reason for CRM: Patient called stating that she sent her provider a message in [facility]- wants to know why the provider has not responded- said she has a headache Asked to be connected directly to the clinic  Called CAL, spoke with Cena- said [facility] messages have been received from the patient/ the office is aware and will call the patient back once provider/ clinical nurse is available   Please called at [phone]

## 2024-07-27 NOTE — Telephone Encounter (Signed)
 FYI Only or Action Required?: Action required by provider: clinical question for provider and advised ED.  Patient was last seen in primary care on 07/14/2024 by Leslie Barry, Leslie K, MD.  Called Nurse Triage reporting Headache.  Symptoms began today.  Interventions attempted: Prescription medications: rizatriptan (MAXALT-MLT) 10 MG.  Symptoms are: unchanged.  Triage Disposition: Go to ED Now (or PCP Triage)  Patient/caregiver understands and will follow disposition?: No, wishes to speak with PCP   Copied from CRM #[id number]. Topic: Clinical - Red Word Triage >> Jul 27, 2024  4:58 PM Zebedee SAUNDERS wrote: Red Word that prompted transfer to Nurse Triage: Pt has called twice wanting to know if she can take more than 2. Pt stated if she does not get an answer she will take the whole bottle of tiZANidine  (ZANAFLEX ) 2 MG tablet. Reason for Disposition  [1] SEVERE headache (e.g., excruciating) AND [2] fever  Answer Assessment - Initial Assessment Questions Advised ED now.   Patient declines ED and request call back; clarification needed: how many rizatriptan (MAXALT-MLT) 10 MG can take within 24 hours.   Nurse read prescription to patient:  rizatriptan 10 MG: Take 1 tablet (10 mg total) by mouth as needed for migraine. May repeat in 2 hours if needed   Patient states she said that she would take the entire bottle because her head has been hurting since this morning and she took 2 rizatriptan pills and not working. Patient reports have been calling and never got call back and now clinic is closed. Patient asked how many pills she could take within a day.    Nurse asked patient if she wants to kill or hurt herself, patient states no. Patient reports feels safe.   1. LOCATION: Where does it hurt?     frontal 2. ONSET: When did the headache start? (e.g., minutes, hours, days)      10 AM, took 2 pills, not effective 3. PATTERN: Does the pain come and go, or has it been constant since it  started?     constant 4. SEVERITY: How bad is the pain? and What does it keep you from doing?  (e.g., Scale 1-10; mild, moderate, or severe)     10/10 5. RECURRENT SYMPTOM: Have you ever had headaches before? If Yes, ask: When was the last time? and What happened that time?      Last Friday 6. CAUSE: What do you think is causing the headache?     unsure 7. MIGRAINE: Have you been diagnosed with migraine headaches? If Yes, ask: Is this headache similar?      no 8. HEAD INJURY: Has there been any recent injury to your head?      no 9. OTHER SYMPTOMS: Do you have any other symptoms? (e.g., fever, stiff neck, eye pain, sore throat, cold symptoms)     Denies blurred vision, dizziness, n/v  Protocols used: Headache-A-AH

## 2024-07-28 ENCOUNTER — Other Ambulatory Visit: Payer: Self-pay | Admitting: Family Medicine

## 2024-07-28 ENCOUNTER — Telehealth: Payer: Self-pay | Admitting: Family Medicine

## 2024-07-28 DIAGNOSIS — R519 Headache, unspecified: Secondary | ICD-10-CM

## 2024-07-28 MED ORDER — BUTALBITAL-APAP-CAFFEINE 50-325-40 MG PO TABS
ORAL_TABLET | ORAL | 0 refills | Status: AC
Start: 2024-07-28 — End: ?

## 2024-07-28 NOTE — Telephone Encounter (Unsigned)
 Copied from CRM [id number]. Topic: Clinical - Red Word Triage >> Jul 27, 2024  4:58 PM Zebedee SAUNDERS wrote: Red Word that prompted transfer to Nurse Triage: Pt has called twice wanting to know if she can take more than 2. Pt stated if she does not get an answer she will take the whole bottle of tiZANidine  (ZANAFLEX ) 2 MG tablet. >> Jul 28, 2024 11:50 AM Pinkey ORN wrote: Patient refuses to speak with NT. CAL states they've relayed the information to the CMA and she's getting the information over to Dr. Ziglar.  >> Jul 28, 2024 11:43 AM Pinkey ORN wrote: Patient states she was told she would receive a call back to advise how much medication she could take for her headache. Patient states she's still needing guidance and is still experiencing symptoms of worsening headache.

## 2024-07-28 NOTE — Telephone Encounter (Signed)
 Incoming call from E2C2 who had patient on the phone.  Patient is requesting to know how much medication should she take.  4 CRMs sent yesterday and no one has called her back .Patient is threatening to the take a whole bottle of pills if no one calls her back today.  Refused to speak with nurse triage.  Information has been relayed to the CMA who will relay to provider .  Leslie Barry

## 2024-07-28 NOTE — Telephone Encounter (Signed)
 LM that I am trying to reach her.  Her phone is not available.

## 2024-07-30 ENCOUNTER — Telehealth: Payer: Self-pay

## 2024-07-30 ENCOUNTER — Telehealth: Payer: Self-pay | Admitting: Family Medicine

## 2024-07-30 NOTE — Telephone Encounter (Signed)
 Copied from CRM #[id number]. Topic: General - Other >> Jul 30, 2024  1:15 PM Dedra B wrote: Reason for CRM: Pt would like a reply on MyChart or call back from nurse regarding a referral to a new PCP. When calling pt back, pls speak with her regarding her attitude. She was quite disrespectful cursing at me and even calling me a b before hanging up.

## 2024-07-30 NOTE — Telephone Encounter (Signed)
 Copied from CRM [id number]. Topic: Clinical - Medication Question >> Jul 27, 2024  4:34 PM Dedra B wrote: Reason for CRM: Pt said she took 2 rizatriptan so far and wants to know if she can take another one.

## 2024-07-30 NOTE — Telephone Encounter (Signed)
 Spoke with patient regarding headaches. Patient states that Fioricet is not helping. She declines to go to ED. I also informed patient that abusive language and behavior may result in dismissal from practice. Patient states she wants a referral to a new PCP. I notified patient that referral is not required and that she just needs to locate a provider who accepts her insurance. Patient verbalized understanding.

## 2024-07-30 NOTE — Telephone Encounter (Signed)
 Patient has spoken to Dr. Ziglar via phone call.

## 2024-07-30 NOTE — Telephone Encounter (Signed)
 Spoke with patient who stated her headache was not better.  It is not a 10 BUT IT IS ALL DAY.  SHE WAKES UP WITHIN THE MORNING AND HAS IT ALL DAY LONG.  WE HAVE TRIED MAXALT, Tizanidine  and Fioricet without improvement in her headache.  Advised if her headache is this bad she needs to go to the ED where she can get a scan.  She reports if it cost her any money she is going to be very angry.  She states that they do not find anything on her scan and it would have been for nothing.

## 2024-08-01 ENCOUNTER — Encounter (HOSPITAL_BASED_OUTPATIENT_CLINIC_OR_DEPARTMENT_OTHER): Payer: Self-pay | Admitting: Family Medicine

## 2024-08-01 ENCOUNTER — Other Ambulatory Visit: Payer: Self-pay

## 2024-08-01 ENCOUNTER — Emergency Department
Admission: EM | Admit: 2024-08-01 | Discharge: 2024-08-01 | Disposition: A | Discharge status: Home / Self Care | Attending: Emergency Medicine | Admitting: Emergency Medicine

## 2024-08-01 DIAGNOSIS — R110 Nausea: Secondary | ICD-10-CM | POA: Insufficient documentation

## 2024-08-01 DIAGNOSIS — R519 Headache, unspecified: Secondary | ICD-10-CM | POA: Diagnosis present

## 2024-08-01 MED ORDER — SODIUM CHLORIDE 0.9 % IV BOLUS: 1000.0000 mL | Freq: Once | INTRAVENOUS | Status: DC

## 2024-08-01 MED ORDER — IBUPROFEN 600 MG PO TABS
600.0000 mg | ORAL_TABLET | Freq: Once | ORAL | Status: AC
  Administered 2024-08-01: 600 mg via ORAL
  Filled 2024-08-01: qty 1

## 2024-08-01 MED ORDER — ONDANSETRON HCL 4 MG/2ML IJ SOLN
4.0000 mg | Freq: Once | INTRAMUSCULAR | Status: DC
  Filled 2024-08-01: qty 2

## 2024-08-01 MED ORDER — KETOROLAC TROMETHAMINE 15 MG/ML IJ SOLN
15.0000 mg | Freq: Once | INTRAMUSCULAR | Status: DC
  Filled 2024-08-01: qty 1

## 2024-08-01 MED ORDER — DEXAMETHASONE SOD PHOSPHATE PF 10 MG/ML IJ SOLN: 10.0000 mg | Freq: Once | INTRAMUSCULAR | Status: DC

## 2024-08-01 NOTE — ED Notes (Signed)
 Pt refused IV and IV meds  Provider made aware

## 2024-08-01 NOTE — ED Triage Notes (Signed)
 Pt to ED for chronic recurrent headaches. Has been seeing PCP for this. States this HA started 9 days ago and has photosensitivity. HA is bilateral and frontal. States she thinks it could be caused because she is looking at screens all day.

## 2024-08-01 NOTE — ED Notes (Signed)
 PO IBU given  This nurse asked again about the IV meds I told you last time that I did not want the IV. Pt took IBU  Stomped her foot  States we never do any thing for her here

## 2024-08-01 NOTE — ED Provider Notes (Signed)
 [facility] Provider Note    Event Date/Time   First MD Initiated Contact with Patient 08/01/24 1359     (approximate)   History   Headache   HPI  Leslie Barry is a 18 y.o. female with history of eczema, bronchitis presents emergency department complaint of a headache for about 9 days.  States she is sensitive to noise and light.  Some nausea but no vomiting.  Unsure of why she has had headaches.  Has had headaches on and off for a while but not this bad.  States occasionally she will get rib pain or back pain.  No numbness or tingling that she knows of.      Physical Exam   Triage Vital Signs: ED Triage Vitals  Encounter Vitals Group     BP 08/01/24 1350 122/87     Girls Systolic BP Percentile --      Girls Diastolic BP Percentile --      Boys Systolic BP Percentile --      Boys Diastolic BP Percentile --      Pulse Rate 08/01/24 1350 66     Resp 08/01/24 1350 16     Temp 08/01/24 1350 99 F (37.2 C)     Temp Source 08/01/24 1350 Oral     SpO2 08/01/24 1350 100 %     Weight 08/01/24 1349 96 lb 9.6 oz (43.8 kg)     Height 08/01/24 1349 5' (1.524 m)     Head Circumference --      Peak Flow --      Pain Score 08/01/24 1347 8     Pain Loc --      Pain Education --      Exclude from Growth Chart --     Most recent vital signs: Vitals:   08/01/24 1350  BP: 122/87  Pulse: 66  Resp: 16  Temp: 99 F (37.2 C)  SpO2: 100%     General: Awake, no distress.   CV:  Good peripheral perfusion.  Resp:  Normal effort.  Abd:  No distention.   Other:  Cranial nerves II through XII grossly intact, PERRL, EOMI, grips equal bilaterally, patient looks well,   ED Results / Procedures / Treatments   Labs (all labs ordered are listed, but only abnormal results are displayed) Labs Reviewed - No data to display   EKG     RADIOLOGY     PROCEDURES:   Procedures  Critical Care:  no Chief Complaint  Patient presents with   Headache       MEDICATIONS ORDERED IN ED: Medications  ibuprofen (ADVIL) tablet 600 mg (has no administration in time range)     IMPRESSION / MDM / ASSESSMENT AND PLAN / ED COURSE  I reviewed the triage vital signs and the nursing notes.                              Differential diagnosis includes, but is not limited to, migraine, tension headache, optic neuritis, MS, tumor  Patient's presentation is most consistent with acute illness / injury with system symptoms.   Medications given, normal saline 1 L IV, Toradol 15 mg IV, Decadron  10 mg IV and Zofran 4 mg IV  Will see how patient feels after medications.  Still has headache we will go ahead and do CT of the head otherwise feel with no red flags at this time she can follow-up outpatient  with neurology.   Patient is refusing the IV medications.  Just wants a pill.  Told her we could give her ibuprofen, feel that she would feel better with the IV medication.  Patient got up stomped her foot and said she is always wasting her time here in the emergency department and would just like to go home.  I did offer her pain medication, we had offered her IV medication, patient is refusing all treatment.  She can follow-up with her doctor, or neurology.   FINAL CLINICAL IMPRESSION(S) / ED DIAGNOSES   Final diagnoses:  Bad headache     Rx / DC Orders   ED Discharge Orders     None        Note:  This document was prepared using Dragon voice recognition software and may include unintentional dictation errors.    Gasper Devere ORN, PA-C 08/01/24 1518    Waymond Lorelle Cummins, MD 08/01/24 [phone]

## 2024-08-02 ENCOUNTER — Other Ambulatory Visit: Payer: Self-pay | Admitting: Family Medicine

## 2024-08-02 DIAGNOSIS — R519 Headache, unspecified: Secondary | ICD-10-CM

## 2024-08-03 ENCOUNTER — Ambulatory Visit

## 2024-08-10 ENCOUNTER — Encounter: Payer: Self-pay | Admitting: Clinical

## 2024-08-10 ENCOUNTER — Other Ambulatory Visit: Payer: Self-pay | Admitting: Family Medicine

## 2024-08-10 ENCOUNTER — Ambulatory Visit: Admitting: Clinical

## 2024-08-10 DIAGNOSIS — R079 Chest pain, unspecified: Secondary | ICD-10-CM

## 2024-08-10 DIAGNOSIS — F419 Anxiety disorder, unspecified: Secondary | ICD-10-CM

## 2024-08-10 NOTE — Progress Notes (Signed)
 Behavioral Health Treatment Plan     Name:Leslie Barry   [facility] [place] [id number]    MRN: [id number]     Treatment Plan Development Date: 08/10/24     Strengths: grandmother   Supports: grandmother     Client Statement of Needs: Patient stated, somewhere to talk about my problems, work though stressors.      Treatment Level: outpatient therapy   Client Treatment Preferences: virtual sessions in the afternoons     Diagnosis: Anxiety   Anxiety Disorder, Unspecified type   Symptoms:   Restlessness or feeling keyed up or on edge, Difficulty concentrating or mind going blank, Irritability, Muscle Tension, Sleep disturbance (Difficulty falling or staying asleep, restlessness, or unsatisfying sleep, and expectation of the worst occurring   Goals:   Improve daily functioning by reducing overall anxiety symptoms including intensity, frequency, and duration of symptoms., Resolve issues and concerns that are resulting in anxiety., Build and employ tools and skills to reduce symptoms of anxiety, worry, and improve functioning day-to-day., and Address negative cognitions, distortions, and behaviors that contribute to anxiety symptoms and affect overall functioning.   Objectives: Target Date For All Objectives: 08/10/25   Develop coping tools to manage anxiety including mindfulness, acceptance, relaxation, reframing, and challenging negative thoughts and feelings., Identify, challenge, and manage negative self-talk, negative thinking about self and others, and maintain mindfulness regarding self-talk and cognitive distortions., Identify contributing factors to anxiety including previous or current situations., and Maintain mindfulness of symptoms and develop protocol to address symptoms to prevent relapse.   Progress Documentation:   Established 08/10/24   Interventions:   Psychoeducation regarding diagnoses and treatment, Rapport Building, CBT - reframing,  challenging, cognitive restructuring, DBT, Relaxation and mindfulness, Distress Tolerance, and conflict resolution     Expected duration of treatment: 12 months   Party responsible for implementation of interventions: Darice Seats, MSW, LCSW and [name], patient     This plan has been reviewed and created by the following participants: Darice Seats, MSW, LCSW and [name] (patient)   A new plan will be created at least every 12 months.   The patient fully participated in the development of treatment plan with the clinician and verbally consents to such treatment.    Patient Treatment Plan Signature Obtained: No, pending signature via MyChart.       Darice Seats, LCSW              Darice Seats, [place]

## 2024-08-10 NOTE — Progress Notes (Signed)
 [facility] Counselor/Therapist Progress Note  Patient ID: Leslie Barry, MRN: [id number],    Date: 08/10/2024  Time Spent: 1:32pm - 2:16pm : 44 minutes   Treatment Type: Individual Therapy  Reported Symptoms: irritability  Mental Status Exam: Appearance:  Neat and Well Groomed     Behavior: Appropriate  Motor: Normal  Speech/Language:  Clear and Coherent and Normal Rate  Affect: Appropriate  Mood: normal  Thought process: normal  Thought content:   WNL  Sensory/Perceptual disturbances:   WNL  Orientation: oriented to person, place, time/date, and situation  Attention: Good  Concentration: Good  Memory: WNL  Fund of knowledge:  Good  Insight:   Good  Judgment:  Good  Impulse Control: Good   Risk Assessment: Danger to Self:  No Patient denied current suicidal ideation  Self-injurious Behavior: No Danger to Others: No Patient denied current homicidal ideation Duty to Warn:no Physical Aggression / Violence:No  Access to Firearms a concern: No  Gang Involvement:No   Subjective: Patient stated, still irritated, still yelling at people, everyday.  Patient stated, I'm cool in response to current mood. Patient reported a history of anxiety while driving and reported sweating, feelings of panic while driving until approximately 2 years ago. Patient reported sweating palms and decreased focus while on an escalator in October. Patient reported no questions regarding diagnosis. Patient stated, that's cool in response to recommendation for therapy. Patient reported I just don't care to talk to people, friendly conversation I don't care for.   Interventions: Motivational Interviewing. Clinician conducted session via caregility video from clinician's office at [facility]. Patient provided verbal consent to proceed with telehealth session and is aware of limitations of telephone or video visits. Patient participated in session from patient's home.  Clinician reviewed diagnosis and treatment recommendations; and provided psycho education related to diagnosis and treatment as part of treatment planning process. Clinician utilized motivational interviewing to explore potential goals for therapy. Clinician utilized a task centered approach in collaboration with patient to develop treatment plan. Patient participated in development of treatment plan and verbally agreed to treatment plan.  Collaboration of Care: Other not required at this time  Diagnosis:Anxiety disorder, unspecified type  Plan: Behavioral Health Treatment Plan   Name:Leslie Barry  [facility] [id number]   MRN: [id number]   Treatment Plan Development Date: 08/10/24   Strengths: grandmother  Supports: grandmother   Client Statement of Needs: Patient stated, somewhere to talk about my problems, work though stressors.    Treatment Level: outpatient therapy  Client Treatment Preferences: virtual sessions in the afternoons   Diagnosis: Anxiety  Anxiety Disorder, Unspecified type  Symptoms:  Restlessness or feeling keyed up or on edge, Difficulty concentrating or mind going blank, Irritability, Muscle Tension, Sleep disturbance (Difficulty falling or staying asleep, restlessness, or unsatisfying sleep, and expectation of the worst occurring  Goals:  Improve daily functioning by reducing overall anxiety symptoms including intensity, frequency, and duration of symptoms., Resolve issues and concerns that are resulting in anxiety., Build and employ tools and skills to reduce symptoms of anxiety, worry, and improve functioning day-to-day., and Address negative cognitions, distortions, and behaviors that contribute to anxiety symptoms and affect overall functioning.  Objectives: Target Date For All Objectives: 08/10/25  Develop coping tools to manage anxiety including mindfulness, acceptance, relaxation, reframing, and challenging negative  thoughts and feelings., Identify, challenge, and manage negative self-talk, negative thinking about self and others, and maintain mindfulness regarding self-talk and cognitive distortions., Identify contributing factors to anxiety including previous  or current situations., and Maintain mindfulness of symptoms and develop protocol to address symptoms to prevent relapse.  Progress Documentation:  Established 08/10/24  Interventions:  Psychoeducation regarding diagnoses and treatment, Rapport Building, CBT - reframing, challenging, cognitive restructuring, DBT, Relaxation and mindfulness, Distress Tolerance, and conflict resolution   Expected duration of treatment: 12 months  Party responsible for implementation of interventions: Darice Seats, MSW, LCSW and [name], patient   This plan has been reviewed and created by the following participants: Darice Seats, MSW, LCSW and [name] (patient)  A new plan will be created at least every 12 months.  The patient fully participated in the development of treatment plan with the clinician and verbally consents to such treatment.   Patient Treatment Plan Signature Obtained: No, pending signature via MyChart.    Darice Seats, LCSW

## 2024-08-10 NOTE — Progress Notes (Signed)
   Darice Seats, LCSW

## 2024-08-27 NOTE — Progress Notes (Unsigned)
 Gynecology Annual Exam  PCP: Pcp, No  Chief Complaint:  Chief Complaint  Patient presents with   Annual Exam   Injections    Depo    History of Present Illness: Patient is a 18 y.o. G0P0000 presents for annual exam. The patient has no complaints today. She denies any current concerns for vaginitis. She declines breast exam.  LMP: No LMP recorded. Patient has had an injection. Menarche:11 Average Interval: no bleeding on depo Intermenstrual Bleeding: not applicable Postcoital Bleeding: not applicable Dysmenorrhea: not applicable  The patient is not currently sexually active. She currently uses Depo-Provera  injections for contraception. She denies dyspareunia.  The patient does perform self breast exams.  There is notable family history of breast or ovarian cancer in her family. Her mother had breast cancer at age 81.  The patient wears seatbelts: yes.  The patient has regular exercise: limited physical activity, she admits healthy diet, adequate hydration and adequate sleep. She is currently not working. Lives with her grandmother.    The patient denies current symptoms of depression.    Review of Systems: Review of Systems  Constitutional:  Negative for chills and fever.  HENT:  Negative for congestion, ear discharge, ear pain, hearing loss, sinus pain and sore throat.   Eyes:  Negative for blurred vision and double vision.  Respiratory:  Negative for cough, shortness of breath and wheezing.   Cardiovascular:  Negative for chest pain, palpitations and leg swelling.  Gastrointestinal:  Negative for abdominal pain, blood in stool, constipation, diarrhea, heartburn, melena, nausea and vomiting.  Genitourinary:  Negative for dysuria, flank pain, frequency, hematuria and urgency.  Musculoskeletal:  Negative for back pain, joint pain and myalgias.  Skin:  Negative for itching and rash.  Neurological:  Negative for dizziness, tingling, tremors, sensory change, speech change,  focal weakness, seizures, loss of consciousness, weakness and headaches.  Endo/Heme/Allergies:  Negative for environmental allergies. Does not bruise/bleed easily.  Psychiatric/Behavioral:  Negative for depression, hallucinations, memory loss, substance abuse and suicidal ideas. The patient is not nervous/anxious and does not have insomnia.     Past Medical History:  Patient Active Problem List   Diagnosis Date Noted Date Diagnosed   Chest pain 07/15/2024    Bronchitis 06/16/2024    Anxiety and depression 03/09/2024    Eczema 03/09/2024    Fatigue 01/26/2024    Allergic rhinitis 01/26/2024    Body odor 01/26/2024     Past Surgical History:  Past Surgical History:  Procedure Laterality Date   WISDOM TOOTH EXTRACTION      Gynecologic History:  No LMP recorded. Patient has had an injection. Contraception: Depo-Provera  injections Last Pap: First PAP is due next year at age 20  Obstetric History: G0P0000  Family History:  Family History  Problem Relation Age of Onset   Breast cancer Mother 39   Breast cancer Maternal Grandmother 81    Social History:  Social History   Socioeconomic History   Marital status: Single    Spouse name: Not on file   Number of children: 0   Years of education: Not on file   Highest education level: Not on file  Occupational History   Not on file  Tobacco Use   Smoking status: Former    Types: E-cigarettes    Start date: 09/17/2019    Quit date: 04/16/2022    Years since quitting: 2.3    Passive exposure: Past   Smokeless tobacco: Never  Vaping Use   Vaping status: Former  Start date: 09/17/2019   Quit date: 04/16/2022  Substance and Sexual Activity   Alcohol  use: Not Currently   Drug use: Not Currently   Sexual activity: Not Currently    Birth control/protection: Injection  Other Topics Concern   Not on file  Social History Narrative   ** Merged History Encounter **       Social Drivers of Health   Tobacco Use: Medium Risk  (08/30/2024)   Patient History    Smoking Tobacco Use: Former    Smokeless Tobacco Use: Never    Passive Exposure: Past  Physicist, Medical Strain: Low Risk  (08/03/2024)   Received from [facility]   Overall Financial Resource Strain (CARDIA)    Difficulty of Paying Living Expenses: Not very hard  Food Insecurity: Food Insecurity Present (08/03/2024)   Received from [facility]   Epic    Within the past 12 months, you worried that your food would run out before you got the money to buy more.: Often true    Within the past 12 months, the food you bought just didn't last and you didn't have money to get more.: Often true  Transportation Needs: No Transportation Needs (08/03/2024)   Received from [facility]    In the past 12 months, has lack of transportation kept you from medical appointments or from getting medications?: No    Lack of Transportation (Non-Medical): No  Physical Activity: Not on file  Stress: Not on file  Social Connections: Not on file  Intimate Partner Violence: Not on file  Depression (PHQ2-9): Low Risk (08/30/2024)   Depression (PHQ2-9)    PHQ-2 Score: 0  Alcohol  Screen: Not on file  Housing: Low Risk  (08/03/2024)   Received from [facility]   Epic    In the last 12 months, was there a time when you were not able to pay the mortgage or rent on time?: No    In the past 12 months, how many times have you moved where you were living?: 0    At any time in the past 12 months, were you homeless or living in a shelter (including now)?: No  Utilities: Not At Risk (08/03/2024)   Received from [facility]   Epic    In the past 12 months has the electric, gas, oil, or water company threatened to shut off services in your home?: No  Health Literacy: Not on file    Allergies:  Allergies[1]  Medications: Prior to Admission medications  Medication Sig  Start Date End Date Taking? Authorizing Provider  albuterol  (VENTOLIN  HFA) 108 (90 Base) MCG/ACT inhaler Inhale 2 puffs into the lungs every 6 (six) hours as needed for wheezing or shortness of breath. 07/01/24   Dunlap, Summer, PA-C  aluminum chloride (DRYSOL) 20 % external solution Apply topically at bedtime. 07/05/24   Raymund Lauraine BROCKS, MD  butalbital -acetaminophen-caffeine  (FIORICET) 50-325-40 MG tablet Take 1 po q 6 hours for headache.  No more than 3 in 24 hours 07/28/24   Ziglar, Susan K, MD  doxycycline  (VIBRAMYCIN ) 100 MG capsule Take 1 capsule (100 mg total) by mouth 2 (two) times daily. 07/14/24   Ziglar, Susan K, MD  loratadine  (CLARITIN ) 10 MG tablet Take 1 tablet (10 mg total) by mouth daily. 04/09/24   Ziglar, Susan K, MD  medroxyPROGESTERone  (DEPO-PROVERA ) 150 MG/ML injection Inject 150 mg into the muscle every 3 (three) months.  [provider]  meloxicam  (MOBIC ) 7.5 MG tablet Take 1 tablet (7.5 mg total) by mouth 2 (two) times daily. 07/14/24   Ziglar, Susan K, MD  predniSONE  (STERAPRED UNI-PAK 48 TAB) 10 MG (48) TBPK tablet Take by mouth daily. 12-day taper pack, use as directed for taper 06/16/24   Ziglar, Susan K, MD  rizatriptan  (MAXALT -MLT) 10 MG disintegrating tablet Take 1 tablet (10 mg total) by mouth as needed for migraine. May repeat in 2 hours if needed 07/26/24   Ziglar, Susan K, MD  Ruxolitinib Phosphate  (OPZELURA ) 1.5 % CREA Apply topically twice daily to affected areas 07/05/24   Raymund Lauraine BROCKS, MD  tacrolimus  (PROTOPIC ) 0.1 % ointment Apply topically 2 (two) times daily. 06/16/24   Ziglar, Susan K, MD  tiZANidine  (ZANAFLEX ) 2 MG tablet Take 1 tablet (2 mg total) by mouth 3 (three) times daily. 07/15/24   Ziglar, Susan K, MD  triamcinolone  cream (KENALOG ) 0.1 % Apply 1 Application topically 2 (two) times daily. 03/09/24   Ziglar, Susan K, MD  triamcinolone  cream (KENALOG ) 0.1 % Apply 1 gr twice daily to affected areas of skin. Stop once resolved and restart as  needed for flares. Avoid use on face, armpits, groin unless otherwise indicated. 07/05/24   Raymund Lauraine BROCKS, MD    Physical Exam Vitals: BP 106/74   Pulse (!) 102   Ht 5' (1.524 m)   Wt 99 lb (44.9 kg)   BMI 19.33 kg/m   General: NAD HEENT: normocephalic, anicteric Thyroid: no enlargement, no palpable nodules Pulmonary: No increased work of breathing, CTAB Cardiovascular: RRR, distal pulses 2+ Breast: exam declined Abdomen: NABS, soft, non-tender, non-distended.  Umbilicus without lesions.  No hepatomegaly, splenomegaly or masses palpable. No evidence of hernia  Genitourinary: deferred for no concerns/not due for PAP smear Extremities: no edema, erythema, or tenderness Neurologic: Grossly intact Psychiatric: mood appropriate, affect full   Assessment: 18 y.o. G0P0000 routine annual exam  Plan: Problem List Items Addressed This Visit   None Visit Diagnoses       Well woman exam    -  Primary     Encounter for management and injection of depo-Provera        Relevant Medications   medroxyPROGESTERone  (DEPO-PROVERA ) injection 150 mg (Completed)   Other Relevant Orders   POCT urine pregnancy (Completed)       1) 4) Gardasil Series discussed and if applicable offered to patient - Patient has previously completed 2 shot series   2) STI screening  was offered and declined  3)  ASCCP guidelines and rationale discussed.  Patient opts for begin at age 37 screening interval  4) Contraception - the patient is currently using  Depo-Provera  injections.  She is happy with her current form of contraception and plans to continue We discussed safe sex practices to reduce her furture risk of STI's.    5) Return in about 12 weeks (around 11/22/2024) for depo provera .   Slater Rains, CNM 08/30/2024 11:58 AM         [1] No Known Allergies

## 2024-08-27 NOTE — Patient Instructions (Signed)
 Preventive Care 80-20 Years Old, Female Preventive care refers to lifestyle choices and visits with your health care provider that can promote health and wellness. At this stage in your life, you may start seeing a primary care physician instead of a pediatrician for your preventive care. Preventive care visits are also called wellness exams. What can I expect for my preventive care visit? Counseling During your preventive care visit, your health care provider may ask about your: Medical history, including: Past medical problems. Family medical history. Pregnancy history. Current health, including: Menstrual cycle. Method of birth control. Emotional well-being. Home life and relationship well-being. Sexual activity and sexual health. Lifestyle, including: Alcohol , nicotine  or tobacco, and drug use. Access to firearms. Diet, exercise, and sleep habits. Sunscreen use. Motor vehicle safety. Physical exam Your health care provider may check your: Height and weight. These may be used to calculate your BMI (body mass index). BMI is a measurement that tells if you are at a healthy weight. Waist circumference. This measures the distance around your waistline. This measurement also tells if you are at a healthy weight and may help predict your risk of certain diseases, such as type 2 diabetes and high blood pressure. Heart rate and blood pressure. Body temperature. Skin for abnormal spots. Breasts. What immunizations do I need?  Vaccines are usually given at various ages, according to a schedule. Your health care provider will recommend vaccines for you based on your age, medical history, and lifestyle or other factors, such as travel or where you work. What tests do I need? Screening Your health care provider may recommend screening tests for certain conditions. This may include: Vision and hearing tests. Lipid and cholesterol levels. Pelvic exam and Pap test. Hepatitis B  test. Hepatitis C test. HIV (human immunodeficiency virus) test. STI (sexually transmitted infection) testing, if you are at risk. Tuberculosis skin test if you have symptoms. BRCA-related cancer screening. This may be done if you have a family history of breast, ovarian, tubal, or peritoneal cancers. Talk with your health care provider about your test results, treatment options, and if necessary, the need for more tests. Follow these instructions at home: Eating and drinking  Eat a healthy diet that includes fresh fruits and vegetables, whole grains, lean protein, and low-fat dairy products. Drink enough fluid to keep your urine pale yellow. Do not drink alcohol  if: Your health care provider tells you not to drink. You are pregnant, may be pregnant, or are planning to become pregnant. You are under the legal drinking age. In the [place] the legal drinking age is 76. If you drink alcohol : Limit how much you have to 0-1 drink a day. Know how much alcohol  is in your drink. In the [place] one drink equals one 12 oz bottle of beer (355 mL), one 5 oz glass of wine (148 mL), or one 1 oz glass of hard liquor (44 mL). Lifestyle Brush your teeth every morning and night with fluoride toothpaste. Floss one time each day. Exercise for at least 30 minutes 5 or more days of the week. Do not use any products that contain nicotine  or tobacco. These products include cigarettes, chewing tobacco, and vaping devices, such as e-cigarettes. If you need help quitting, ask your health care provider. Do not use drugs. If you are sexually active, practice safe sex. Use a condom or other form of protection to prevent STIs. If you do not wish to become pregnant, use a form of birth control. If you plan to become pregnant,  see your health care provider for a prepregnancy visit. Find healthy ways to manage stress, such as: Meditation, yoga, or listening to music. Journaling. Talking to a trusted person. Spending time  with friends and family. Safety Always wear your seat belt while driving or riding in a vehicle. Do not drive: If you have been drinking alcohol . Do not ride with someone who has been drinking. When you are tired or distracted. While texting. If you have been using any mind-altering substances or drugs. Wear a helmet and other protective equipment during sports activities. If you have firearms in your house, make sure you follow all gun safety procedures. Seek help if you have been bullied, physically abused, or sexually abused. Use the internet responsibly to avoid dangers, such as online bullying and online sex predators. What's next? Go to your health care provider once a year for an annual wellness visit. Ask your health care provider how often you should have your eyes and teeth checked. Stay up to date on all vaccines. This information is not intended to replace advice given to you by your health care provider. Make sure you discuss any questions you have with your health care provider. Document Revised: 02/28/2021 Document Reviewed: 02/28/2021 Elsevier Patient Education  2024 [employer] How to Do a Breast Self-Exam Doing breast self-exams can help you stay healthy. They're one way to know what's normal for your breasts. They can help you catch a problem while it's still small and can be treated. You need to: Check your breasts often. Tell your doctor about any changes. You should do breast self-exams even if you have breast implants. What you need: A mirror. A well-lit room. A pillow or other soft object. How to do a breast self-exam Look for changes  Take off all the clothes above your waist. Stand in front of a mirror in a room with good lighting. Put your hands down at your sides. Compare your breasts in the mirror. Look for difference between them, such as: Differences in shape. Differences in size. Wrinkles, dips, and bumps in one breast and not the other. Look  at each breast for skin changes, such as: Redness. Scaly spots. Spots where your skin is thicker. Dimpling. Open sores. Look for changes in your nipples, such as: Fluid coming out of a nipple. Fluid around a nipple. Bleeding. Dimpling. Redness. A nipple that looks pushed in or that has changed position. Feel for changes Lie on your back. Feel each breast. To do this: Pick a breast to feel. Place a pillow under the shoulder closest to that breast. Put the arm closest to that breast behind your head. Feel the breast using the hand of your other arm. Use the pads of your three middle fingers to make small circles starting near the nipple. Use light, medium, and firm pressure. Keep making circles, moving down over the breast. Stop when you feel your ribs. Start making circles with your fingers again, this time going up until you reach your collarbone. Then, make circles out across your breast and into your armpit area. Squeeze your nipple. Check for fluid and lumps. Do these steps again to check your other breast. Sit or stand in the tub or shower. With soapy water on your skin, feel each breast the same way you did when you were lying down. Write down what you find Writing down what you find can help you keep track of what you want to tell your doctor. Write down: What's normal for each  breast. Any changes you find. Write down: The kind of change. If your breast feels tender or painful. Any lump you find. Write down its size and where it is. When you last had your period. General tips If you're breastfeeding, the best time to check your breasts is after you feed your baby or after you use a breast pump. If you get a period, the best time to check your breasts is 5-7 days after your period ends. With time, you'll get more used to doing the self-exam. You'll also start to know if there are changes in your breasts. Contact a doctor if: You see a change in the shape or size of your  breasts or nipples. You see a change in the skin of your breast or nipples. You have fluid coming from your nipples that isn't normal. You find a new lump or thick area. You have breast pain. You have any concerns about your breast health. This information is not intended to replace advice given to you by your health care provider. Make sure you discuss any questions you have with your health care provider. Document Revised: 11/12/2023 Document Reviewed: 11/12/2023 Elsevier Patient Education  2025 [employer]. How to Have Safe Sex Having safe sex means taking steps before and during sex to reduce your risk of: Getting a sexually transmitted infection (STI). Giving your partner an STI. Unwanted or unplanned pregnancy. How to have safe sex Ways you can have safe sex  Limit your sex partners to only one partner who is only having sex with you. Avoid using alcohol  and drugs before having sex. Alcohol  and drugs can affect your judgment. Before having sex with a new partner: Talk to your partner about past partners, past STIs, and drug use. Get screened for STIs and discuss the results with your partner. Ask your partner to get screened too. Check your body regularly for sores, blisters, rashes, or unusual discharge. If you notice any of these things, call your health care provider. Avoid sexual contact if you have symptoms of an infection or you're being treated for an STI. While having sex, use a condom. Make sure to: Use a condom every time you have vaginal, oral, or anal sex. Both females and males should wear condoms during oral sex. Do not use a female condom and a female condom at the same time during vaginal sex. Using both types at the same time can cause condoms to break. Keep condoms in place from the beginning to the end of sexual activity. Use a latex condom, if possible. Latex condoms offer the best protection. Use only water-based lubricants with a condom. Using petroleum-based  lubricants or oils will weaken the condom and increase the chance that it will break. Ways your health care provider can help you have safe sex  See your provider for regular screenings, exams, and tests for STIs. Talk with your provider about what kind of birth control is best for you. Get vaccinated against hepatitis B and human papillomavirus (HPV). If you're at risk of getting human immunodeficiency virus (HIV), talk with your provider about taking a medicine to prevent HIV. You're at risk for HIV if you: Are a female who has sex with other males. Are sexually active with more than one partner. Take drugs by injection. Have a sex partner who has HIV. Have unprotected sex. Have sex with someone who has sex with both males and females. Follow these instructions at home: Take your medicines only as told. Call your provider  if you think you might be pregnant. Call your provider if have any symptoms of an infection. Where to find more information Centers for Disease Control and Prevention (CDC): tonerpromos.no Office on Women's Health: [place] This information is not intended to replace advice given to you by your health care provider. Make sure you discuss any questions you have with your health care provider. Document Revised: 01/22/2023 Document Reviewed: 01/22/2023 Elsevier Patient Education  2024 [employer].

## 2024-08-30 ENCOUNTER — Ambulatory Visit: Admitting: Advanced Practice Midwife

## 2024-08-30 ENCOUNTER — Encounter: Payer: Self-pay | Admitting: Advanced Practice Midwife

## 2024-08-30 VITALS — BP 106/74 | HR 102 | Ht 60.0 in | Wt 99.0 lb

## 2024-08-30 DIAGNOSIS — Z3042 Encounter for surveillance of injectable contraceptive: Secondary | ICD-10-CM | POA: Diagnosis not present

## 2024-08-30 DIAGNOSIS — Z3202 Encounter for pregnancy test, result negative: Secondary | ICD-10-CM | POA: Diagnosis not present

## 2024-08-30 DIAGNOSIS — Z0000 Encounter for general adult medical examination without abnormal findings: Secondary | ICD-10-CM

## 2024-08-30 DIAGNOSIS — Z01419 Encounter for gynecological examination (general) (routine) without abnormal findings: Secondary | ICD-10-CM

## 2024-08-30 LAB — POCT URINE PREGNANCY: Preg Test, Ur: NEGATIVE

## 2024-08-30 MED ORDER — MEDROXYPROGESTERONE ACETATE 150 MG/ML IM SUSP
150.0000 mg | Freq: Once | INTRAMUSCULAR | Status: AC
  Administered 2024-08-30: 11:00:00 150 mg via INTRAMUSCULAR

## 2024-08-30 NOTE — Progress Notes (Signed)
 Date last pap: NA. Last Depo-Provera : 05/11/24. Side Effects if any: None. Serum HCG indicated? None.Urine HCG: negative Depo-Provera  150 mg IM given by: Camelia Bars, LPN. Next appointment due 3/9 - 12/06/24.

## 2024-09-13 ENCOUNTER — Ambulatory Visit: Admitting: Clinical

## 2024-09-13 DIAGNOSIS — F419 Anxiety disorder, unspecified: Secondary | ICD-10-CM | POA: Diagnosis not present

## 2024-09-13 NOTE — Progress Notes (Signed)
   Darice Seats, LCSW

## 2024-09-13 NOTE — Progress Notes (Signed)
 "  [facility] Counselor/Therapist Progress Note  Patient ID: Leslie Barry, MRN: [id number],    Date: 09/13/2024  Time Spent: 12:33pm - 1:16pm : 43 minutes   Treatment Type: Individual Therapy  Reported Symptoms: irritability, decreased sleep  Mental Status Exam: Appearance:  Casual     Behavior: Appropriate  Motor: Normal  Speech/Language:  Clear and Coherent and Normal Rate  Affect: Appropriate  Mood: normal  Thought process: normal  Thought content:   WNL  Sensory/Perceptual disturbances:   WNL  Orientation: oriented to person, place, time/date, and situation  Attention: Good  Concentration: Good  Memory: WNL  Fund of knowledge:  Good  Insight:   Good  Judgment:  Good  Impulse Control: Good   Risk Assessment: Danger to Self:  No Patient denied current suicidal ideation  Self-injurious Behavior: No Danger to Others: No Patient denied current homicidal ideation Duty to Warn:no Physical Aggression / Violence:No  Access to Firearms a concern: No  Gang Involvement:No   Subjective: Patient reported patient started a new job last week and stated, it's good in reference to new job.  Patient reported a recent decrease in irritability in public settings. Patient stated, I just woke up so I really can't tell in reference to patient's current mood. Patient reported the following triggers for irritability: being asked multiple questions, too much going on in reference to noise, when something doesn't go through or work, brother shrugging his shoulders, when schedule/plans change, and limited sleep. Patient reported patient woke up at 3 am on Saturday and reported experiencing worry related to patient's work schedule when waking up. Patient reported experiencing worry.   Interventions: Cognitive Behavioral Therapy. Clinician conducted session via caregility video from clinician's home office. Patient provided verbal consent to proceed with telehealth session and  is aware of limitations of telephone or video visits. Patient participated in session from patient's home. Reviewed events since last session and assessed for changes. Explored and identified triggers for irritability and worry. Provided psycho education related to cognitive distortions, completing a thought record, and cognitive restructuring. Clinician requested for homework patient complete a thought record.     Collaboration of Care: Other not required at this time   Diagnosis:Anxiety disorder, unspecified type   Plan: Behavioral Health Treatment Plan     Name:Syretta Shoff   [facility] [id number]    MRN: [id number]     Treatment Plan Development Date: 08/10/24     Strengths: grandmother   Supports: grandmother     Client Statement of Needs: Patient stated, somewhere to talk about my problems, work though stressors.      Treatment Level: outpatient therapy   Client Treatment Preferences: virtual sessions in the afternoons     Diagnosis: Anxiety   Anxiety Disorder, Unspecified type   Symptoms:   Restlessness or feeling keyed up or on edge, Difficulty concentrating or mind going blank, Irritability, Muscle Tension, Sleep disturbance (Difficulty falling or staying asleep, restlessness, or unsatisfying sleep, and expectation of the worst occurring   Goals:   Improve daily functioning by reducing overall anxiety symptoms including intensity, frequency, and duration of symptoms., Resolve issues and concerns that are resulting in anxiety., Build and employ tools and skills to reduce symptoms of anxiety, worry, and improve functioning day-to-day., and Address negative cognitions, distortions, and behaviors that contribute to anxiety symptoms and affect overall functioning.   Objectives: Target Date For All Objectives: 08/10/25   Develop coping tools to manage anxiety including mindfulness, acceptance, relaxation, reframing, and challenging  negative  thoughts and feelings., Identify, challenge, and manage negative self-talk, negative thinking about self and others, and maintain mindfulness regarding self-talk and cognitive distortions., Identify contributing factors to anxiety including previous or current situations., and Maintain mindfulness of symptoms and develop protocol to address symptoms to prevent relapse.   Progress Documentation:   progressing   Interventions:   Psychoeducation regarding diagnoses and treatment, Rapport Building, CBT - reframing, challenging, cognitive restructuring, DBT, Relaxation and mindfulness, Distress Tolerance, and conflict resolution     Expected duration of treatment: 12 months   Party responsible for implementation of interventions: Darice Seats, MSW, LCSW and [name], patient     This plan has been reviewed and created by the following participants: Darice Seats, MSW, LCSW and [name] (patient)   A new plan will be created at least every 12 months.   The patient fully participated in the development of treatment plan with the clinician and verbally consents to such treatment.    Patient Treatment Plan Signature Obtained: No, pending signature via MyChart.         Darice Seats, LCSW    "

## 2024-10-05 ENCOUNTER — Encounter: Payer: Self-pay | Admitting: Advanced Practice Midwife

## 2024-10-06 ENCOUNTER — Ambulatory Visit: Admitting: Certified Nurse Midwife

## 2024-10-06 ENCOUNTER — Other Ambulatory Visit (HOSPITAL_COMMUNITY)
Admission: RE | Admit: 2024-10-06 | Discharge: 2024-10-06 | Disposition: A | Discharge status: Home / Self Care | Source: Ambulatory Visit | Attending: Certified Nurse Midwife | Admitting: Certified Nurse Midwife

## 2024-10-06 ENCOUNTER — Encounter: Payer: Self-pay | Admitting: Certified Nurse Midwife

## 2024-10-06 VITALS — BP 113/74 | HR 113 | Ht 60.0 in | Wt 105.7 lb

## 2024-10-06 DIAGNOSIS — N898 Other specified noninflammatory disorders of vagina: Secondary | ICD-10-CM

## 2024-10-06 DIAGNOSIS — N907 Vulvar cyst: Secondary | ICD-10-CM

## 2024-10-06 NOTE — Progress Notes (Signed)
" ° ° °  GYNECOLOGY PROGRESS NOTE  Subjective:    Patient ID: Leslie Barry, female    DOB: [birth year], 19 y.o.   MRN: [id number]  HPI  Patient is a 19 y.o. G24P0000 female who presents for evaluation of painful bump on her vagina. Leslie Barry reports a long history of getting hard bumps under the skin that typically self resolve. Occasionally, they will become enlarged and inflamed and then drain. She uses an neurosurgeon for hair removal. These areas have always bee located outside of the vaginal tissue.   She has a picture that was taken a few days ago showing a 3cm mass that appears to like a cyst. This had become larger over the course of a few days and then drained a clear fluid. She remained afebrile and the picture did not show signs of infection.   The following portions of the patient's history were reviewed and updated as appropriate: allergies, current medications, past family history, past medical history, past social history, past surgical history, and problem list.  Review of Systems Pertinent items are noted in HPI.   Objective:   Blood pressure 113/74, pulse (!) 113, height 5' (1.524 m), weight 105 lb 11.2 oz (47.9 kg). Body mass index is 20.64 kg/m. General appearance: alert and cooperative Abdomen: soft, non-tender; bowel sounds normal; no masses,  no organomegaly Pelvic: cervix normal in appearance, external genitalia normal, no adnexal masses or tenderness, no cervical motion tenderness, rectovaginal septum normal, uterus normal size, shape, and consistency, and vagina normal without discharge    Assessment:   1. Vulvar cyst      Plan:   Likely these are epidermal inclusion cysts but would recommend a dermatology referral to be sure and management. Recommended to not removed hair for now and see if there is improvement.  PCR of site obtained to rule out HSV lesion although it does not appear to be the case. STI testing complete but Leslie Barry declines blood tests for HIV and  RPR.    Damien Parsley, CNM [facility]

## 2024-10-07 LAB — CERVICOVAGINAL ANCILLARY ONLY
Bacterial Vaginitis (gardnerella): NEGATIVE
Candida Glabrata: NEGATIVE
Candida Vaginitis: NEGATIVE
Chlamydia: NEGATIVE
Comment: NEGATIVE
Comment: NEGATIVE
Comment: NEGATIVE
Comment: NEGATIVE
Comment: NEGATIVE
Comment: NORMAL
Neisseria Gonorrhea: NEGATIVE
Trichomonas: NEGATIVE

## 2024-10-09 LAB — HERPES SIMPLEX VIRUS CULTURE

## 2024-10-11 ENCOUNTER — Ambulatory Visit: Admitting: Clinical

## 2024-10-11 DIAGNOSIS — F419 Anxiety disorder, unspecified: Secondary | ICD-10-CM | POA: Diagnosis not present

## 2024-10-11 NOTE — Progress Notes (Signed)
   Darice Seats, LCSW

## 2024-10-11 NOTE — Progress Notes (Signed)
 "   Behavioral Health Counselor/Therapist Progress Note  Patient ID: Leslie Barry, MRN: [id number],    Date: 10/11/2024  Time Spent: 10:32am - 11:12am : 40 minutes  Treatment Type: Individual Therapy  Reported Symptoms: restlessness  Mental Status Exam: Appearance:  Neat and Well Groomed     Behavior: Minimizing  Motor: Normal  Speech/Language:  Clear and Coherent and Normal Rate  Affect: Blunt  Mood: irritable  Thought process: normal  Thought content:   WNL  Sensory/Perceptual disturbances:   WNL  Orientation: oriented to person, place, time/date, and situation  Attention: Good  Concentration: Good  Memory: WNL  Fund of knowledge:  Good  Insight:   Good  Judgment:  Good  Impulse Control: Good   Risk Assessment: Danger to Self:  No Patient denied current suicidal ideation  Self-injurious Behavior: No Danger to Others: No Patient denied current homicidal ideation Duty to Warn:no Physical Aggression / Violence:No  Access to Firearms a concern: No  Gang Involvement:No   Subjective: Patient stated, nothing positive in response to events since last session and reported no changes since last session.  Patient stated, the same in response to mood since last session. Patient reported feeling irritated today. Patient reported patient completed two entries in patient's thought record. Patient reported feeling the need to rush to perform tasks daily and stated, I just keep thinking about it in reference to tasks to be completed. Patient stated, its not a good emotion in response to thoughts of tasks to be completed. Patient reported feeling upset when patient's hair appointment was cancelled. Patient reported patient visited with patient's grandmother after the cancellation and had a conversation with grandmother. Patient stated, it calmed me down in reference to conversation with grandmother. Patient stated, I just moved on in reference to cancelled hair  appointment. Patient reported patient finds being in the pool relaxing. Patient stated, I can try it in response to deep breathing exercise. Patient stated, I could try it in response to mindfulness exercise. Patient stated, I can try it, it probably wont work in response to gratitude journal.   Interventions: Cognitive Behavioral Therapy. Clinician conducted session via caregility video from clinician's home office. Patient provided verbal consent to proceed with telehealth session and is aware of limitations of telephone or video visits. Patient participated in session from patient's grandmother's home. Reviewed events since last session and assessed for changes. Reviewed thought record entries. Provided psycho education related to connection between situations, thoughts, emotions, and behaviors and CBT. Explored current relaxation strategies patient practices. Provided psycho education related to deep breathing exercise (4-4-6 method), mindfulness exercise of leaves on a stream. Provided psycho education related to cognitive restructuring and use of a gratitude journal. Clinician requested for homework patient continue thought record and practice deep breathing exercise, mindfulness exercise, and gratitude journal.     Collaboration of Care: Other not required at this time   Diagnosis:Anxiety disorder, unspecified type   Plan: Behavioral Health Treatment Plan     Name:Leslie Barry   [facility] [id number]    MRN: [id number]     Treatment Plan Development Date: 08/10/24     Strengths: grandmother   Supports: grandmother     Client Statement of Needs: Patient stated, somewhere to talk about my problems, work though stressors.      Treatment Level: outpatient therapy   Client Treatment Preferences: virtual sessions in the afternoons     Diagnosis: Anxiety   Anxiety Disorder, Unspecified type   Symptoms:   Restlessness  or feeling keyed up or on  edge, Difficulty concentrating or mind going blank, Irritability, Muscle Tension, Sleep disturbance (Difficulty falling or staying asleep, restlessness, or unsatisfying sleep, and expectation of the worst occurring   Goals:   Improve daily functioning by reducing overall anxiety symptoms including intensity, frequency, and duration of symptoms., Resolve issues and concerns that are resulting in anxiety., Build and employ tools and skills to reduce symptoms of anxiety, worry, and improve functioning day-to-day., and Address negative cognitions, distortions, and behaviors that contribute to anxiety symptoms and affect overall functioning.   Objectives: Target Date For All Objectives: 08/10/25   Develop coping tools to manage anxiety including mindfulness, acceptance, relaxation, reframing, and challenging negative thoughts and feelings., Identify, challenge, and manage negative self-talk, negative thinking about self and others, and maintain mindfulness regarding self-talk and cognitive distortions., Identify contributing factors to anxiety including previous or current situations., and Maintain mindfulness of symptoms and develop protocol to address symptoms to prevent relapse.   Progress Documentation:   progressing   Interventions:   Psychoeducation regarding diagnoses and treatment, Rapport Building, CBT - reframing, challenging, cognitive restructuring, DBT, Relaxation and mindfulness, Distress Tolerance, and conflict resolution     Expected duration of treatment: 12 months   Party responsible for implementation of interventions: Darice Seats, MSW, LCSW and [name], patient     This plan has been reviewed and created by the following participants: Darice Seats, MSW, LCSW and [name] (patient)   A new plan will be created at least every 12 months.   The patient fully participated in the development of treatment plan with the clinician and verbally consents to such  treatment.    Patient Treatment Plan Signature Obtained: No, pending signature via MyChart.            Darice Seats, LCSW    "

## 2024-10-18 ENCOUNTER — Ambulatory Visit

## 2024-10-19 ENCOUNTER — Ambulatory Visit

## 2024-10-19 DIAGNOSIS — L750 Bromhidrosis: Secondary | ICD-10-CM | POA: Diagnosis not present

## 2024-10-19 DIAGNOSIS — L74519 Primary focal hyperhidrosis, unspecified: Secondary | ICD-10-CM

## 2024-10-19 DIAGNOSIS — L732 Hidradenitis suppurativa: Secondary | ICD-10-CM

## 2024-10-19 DIAGNOSIS — R61 Generalized hyperhidrosis: Secondary | ICD-10-CM | POA: Diagnosis not present

## 2024-10-19 DIAGNOSIS — L700 Acne vulgaris: Secondary | ICD-10-CM | POA: Diagnosis not present

## 2024-10-19 DIAGNOSIS — L989 Disorder of the skin and subcutaneous tissue, unspecified: Secondary | ICD-10-CM

## 2024-10-19 MED ORDER — CLINDAMYCIN PHOSPHATE 1 % EX SWAB: CUTANEOUS | 5 refills | Status: AC

## 2024-10-19 MED ORDER — DOXYCYCLINE MONOHYDRATE 100 MG PO CAPS: ORAL_CAPSULE | ORAL | 2 refills | Status: AC

## 2024-10-19 MED ORDER — ADAPALENE 0.3 % EX GEL: 1.0000 "application " | Freq: Every day | CUTANEOUS | 5 refills | Status: AC

## 2024-10-19 NOTE — Patient Instructions (Signed)

## 2024-10-29 ENCOUNTER — Ambulatory Visit: Admitting: Clinical

## 2024-11-29 ENCOUNTER — Ambulatory Visit

## 2025-01-03 ENCOUNTER — Ambulatory Visit
